# Patient Record
Sex: Female | Born: 1970 | Race: White | Hispanic: No | Marital: Single | Smoking: Never smoker
Health system: Southern US, Community
[De-identification: ages and names within clinical notes are randomized; demographics above are authoritative.]

## PROBLEM LIST (undated history)

## (undated) DIAGNOSIS — J45909 Unspecified asthma, uncomplicated: Secondary | ICD-10-CM

## (undated) DIAGNOSIS — G43909 Migraine, unspecified, not intractable, without status migrainosus: Secondary | ICD-10-CM

## (undated) DIAGNOSIS — T7840XA Allergy, unspecified, initial encounter: Secondary | ICD-10-CM

## (undated) HISTORY — PX: ABDOMINAL HYSTERECTOMY: SHX81

## (undated) HISTORY — PX: CYST REMOVAL WITH BONE GRAFT: SHX6365

## (undated) HISTORY — PX: KNEE ARTHROSCOPY: SHX127

## (undated) HISTORY — DX: Unspecified asthma, uncomplicated: J45.909

## (undated) HISTORY — DX: Allergy, unspecified, initial encounter: T78.40XA

## (undated) HISTORY — DX: Migraine, unspecified, not intractable, without status migrainosus: G43.909

---

## 2004-10-23 ENCOUNTER — Ambulatory Visit (HOSPITAL_COMMUNITY): Admission: RE | Admit: 2004-10-23 | Discharge: 2004-10-23 | Payer: Self-pay | Admitting: Internal Medicine

## 2005-02-13 ENCOUNTER — Encounter: Admission: RE | Admit: 2005-02-13 | Discharge: 2005-02-13 | Payer: Self-pay | Admitting: Orthopedic Surgery

## 2005-05-10 ENCOUNTER — Emergency Department (HOSPITAL_COMMUNITY): Admission: EM | Admit: 2005-05-10 | Discharge: 2005-05-10 | Payer: Self-pay | Admitting: Emergency Medicine

## 2005-05-17 ENCOUNTER — Ambulatory Visit (HOSPITAL_COMMUNITY): Admission: RE | Admit: 2005-05-17 | Discharge: 2005-05-17 | Payer: Self-pay | Admitting: Internal Medicine

## 2006-02-19 ENCOUNTER — Emergency Department (HOSPITAL_COMMUNITY): Admission: EM | Admit: 2006-02-19 | Discharge: 2006-02-19 | Payer: Self-pay | Admitting: Emergency Medicine

## 2006-06-13 ENCOUNTER — Emergency Department (HOSPITAL_COMMUNITY): Admission: EM | Admit: 2006-06-13 | Discharge: 2006-06-13 | Payer: Self-pay | Admitting: Emergency Medicine

## 2006-07-01 ENCOUNTER — Ambulatory Visit: Payer: Self-pay | Admitting: Gastroenterology

## 2006-07-15 ENCOUNTER — Ambulatory Visit (HOSPITAL_COMMUNITY): Admission: RE | Admit: 2006-07-15 | Discharge: 2006-07-15 | Payer: Self-pay | Admitting: Gastroenterology

## 2006-07-30 ENCOUNTER — Ambulatory Visit: Payer: Self-pay | Admitting: Cardiology

## 2006-07-31 ENCOUNTER — Ambulatory Visit: Payer: Self-pay | Admitting: Gastroenterology

## 2007-03-13 ENCOUNTER — Other Ambulatory Visit: Admission: RE | Admit: 2007-03-13 | Discharge: 2007-03-13 | Payer: Self-pay | Admitting: Internal Medicine

## 2008-03-18 ENCOUNTER — Ambulatory Visit (HOSPITAL_COMMUNITY): Admission: RE | Admit: 2008-03-18 | Discharge: 2008-03-18 | Payer: Self-pay | Admitting: Internal Medicine

## 2008-06-07 ENCOUNTER — Encounter (INDEPENDENT_AMBULATORY_CARE_PROVIDER_SITE_OTHER): Payer: Self-pay | Admitting: Obstetrics and Gynecology

## 2008-06-07 ENCOUNTER — Ambulatory Visit (HOSPITAL_COMMUNITY): Admission: RE | Admit: 2008-06-07 | Discharge: 2008-06-08 | Payer: Self-pay | Admitting: Obstetrics and Gynecology

## 2009-01-24 ENCOUNTER — Emergency Department (HOSPITAL_COMMUNITY): Admission: EM | Admit: 2009-01-24 | Discharge: 2009-01-25 | Payer: Self-pay | Admitting: Emergency Medicine

## 2009-11-24 ENCOUNTER — Ambulatory Visit (HOSPITAL_COMMUNITY): Admission: RE | Admit: 2009-11-24 | Discharge: 2009-11-24 | Payer: Self-pay | Admitting: Internal Medicine

## 2009-11-29 ENCOUNTER — Encounter: Admission: RE | Admit: 2009-11-29 | Discharge: 2009-11-29 | Payer: Self-pay | Admitting: Internal Medicine

## 2010-05-16 ENCOUNTER — Encounter
Admission: RE | Admit: 2010-05-16 | Discharge: 2010-05-16 | Payer: Self-pay | Source: Home / Self Care | Attending: Internal Medicine | Admitting: Internal Medicine

## 2010-05-31 ENCOUNTER — Ambulatory Visit (HOSPITAL_COMMUNITY)
Admission: RE | Admit: 2010-05-31 | Discharge: 2010-05-31 | Payer: Self-pay | Source: Home / Self Care | Attending: Internal Medicine | Admitting: Internal Medicine

## 2010-06-01 ENCOUNTER — Ambulatory Visit
Admission: RE | Admit: 2010-06-01 | Discharge: 2010-06-01 | Payer: Self-pay | Source: Home / Self Care | Attending: Internal Medicine | Admitting: Internal Medicine

## 2010-06-01 DIAGNOSIS — J45909 Unspecified asthma, uncomplicated: Secondary | ICD-10-CM | POA: Insufficient documentation

## 2010-06-01 DIAGNOSIS — R05 Cough: Secondary | ICD-10-CM | POA: Insufficient documentation

## 2010-06-02 ENCOUNTER — Telehealth (INDEPENDENT_AMBULATORY_CARE_PROVIDER_SITE_OTHER): Payer: Self-pay | Admitting: *Deleted

## 2010-06-08 ENCOUNTER — Encounter: Payer: Self-pay | Admitting: Internal Medicine

## 2010-06-08 NOTE — Progress Notes (Signed)
Summary: prescription not at pharmacy  Phone Note Call from Patient   Caller: Patient Call For: Dr wert Summary of Call: Patient phoned stated that she saw Dr. Sherene Sires yesterday and the office was to call a prescription into [facility] on [facility] for prednisone. She went by yesterday and they stated that this had not been called in she called again this morning and was told that this has not been called in yet. Patient can be  reached at [phone] Initial call taken by: Vedia Coffer,  June 02, 2010 10:30 AM  Follow-up for Phone Call        reviewed pt's chart and it looks like MW sent prednisone yesterday electronically.  pt states pharmacy has not received rx yet.  called [facility] on [address] and gave telephone order for rx. pt aware.  Aundra Millet Reynolds LPN  June 02, 2010 10:39 AM

## 2010-06-08 NOTE — Assessment & Plan Note (Signed)
Summary: Pulmonary/ new pt eval   Visit Type:  Initial Consult Copy to:  Dr. Marisue Brooklyn Primary Provider/Referring Provider:  Dr. Marisue Brooklyn  CC:  Positive PPD and Cough.  History of Present Illness: 40 Romero never smoked but tested yearly for TB by [facility] and negative 2010.   June 01, 2010  1st pulmonary office eval cc cough started 2.5 weeks ago dry and not assoc with any other uri symptoms and feels sob even if not coughing.  worse as day goes on, no problem sleeping.Marland Kitchen assoc with mild sore throat and dysphagia   Pt denies any significant sore throat, dysphagia, itching, sneezing,  nasal congestion or excess secretions,  fever, chills, sweats, unintended wt loss, pleuritic or exertional cp, hempoptysis, change in activity tolerance  orthopnea pnd or leg swelling Pt also denies any obvious fluctuation in symptoms with weather or environmental change or other alleviating or aggravating factors.       Current Medications (verified): 1)  None  Allergies (verified): 1)  ! 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  The patient denies productive cough, coughing up blood, chest pain, irregular heartbeats, acid heartburn, indigestion, tooth/dental problems, nasal congestion/difficulty breathing  through nose, sneezing, itching, ear ache, anxiety, depression, joint stiffness or pain, rash, change in color of mucus, and fever.    Vital Signs:  Patient profile:   40 year old female Height:      63 inches Weight:      147 pounds BMI:     26.13 O2 Sat:      98 % on Room air Temp:     98.3 degrees F oral Pulse rate:   68 / minute BP sitting:   108 / 70  (left arm)  Vitals Entered By: Vernie Murders (June 01, 2010 10:Karen AM)  O2 Flow:  Room air  Physical Exam  Additional Exam:  amb wf nad  wt 147 June 01, 2010  HEENT: nl dentition, turbinates, and orophanx. Nl external ear canals without cough reflex NECK :  without JVD/Nodes/TM/ nl carotid upstrokes bilaterally LUNGS: no acc muscle use, clear to A and P bilaterally without cough on insp or exp maneuvers CV:  RRR  no s3 or murmur or increase in P2, no edema  ABD:  soft and nontender with nl excursion in the supine position. No bruits or organomegaly, bowel sounds nl MS:  warm without deformities, calf tenderness, cyanosis or clubbing SKIN: warm and dry without lesions   NEURO:  alert, approp, no deficits     CXR  Procedure date:  05/31/2010  Findings:      wnl  Impression & Recommendations:  Problem # 1:  COUGH (ICD-786.2)  this is classified as acute cough but doesn't have the usual uri symptoms assoc with it.  However, c/w  Classic Upper airway cough syndrome, so named because it's frequently impossible to sort out how much is  CR/sinusitis with freq throat clearing (which can be related to primary GERD)   vs  causing  secondary extra esophageal GERD from wide swings in gastric pressure that occur with throat clearing, promoting self use of mint and menthol lozenges that reduce the lower esophageal sphincter tone and exacerbate the problem further These are the same pts who not infrequently have failed to tolerate ace inhibitors,  dry powder inhalers or biphosphonates or report having reflux symptoms that don't  respond to standard doses of PPI  See instructions for specific recommendations   Orders: Consultation Level V ([address])  Problem # 2:  TUBERCULOSIS EXPOSURE (ICD-V01.1)  cxr is nl She is a converter and will need treatment to prevent recurrence. Since works in a public area have contacted the health dept as first step  Orders: Consultation Level V [phone])  Medications Added to Medication List This Visit: 1)  Omeprazole 20 Mg Cpdr (Omeprazole) .... By mouth daily. take one half hour before eating. 2)  Pepcid 20 Mg Tabs (Famotidine) .... Take one by mouth at bedtime 3)  Prednisone 10 Mg Tabs (Prednisone) .... 4 each am x 2days, 2x2days, 1x2days and stop  Patient Instructions: 1)  Call the health department [phone]  re: your Postivie skin test for TB (16 mm)  but it has nothing to do with your cough 2)  Prilosec  30 min before bfast and pepcid 20 mg at bedtime as long as coughing ( reflux is to cough what oxygen is to fire)  3)  Prednisone 10 mg 4 each am x 2days, 2x2days, 1x2days and stop  4)  GERD (REFLUX)  is a common cause of respiratory symptoms. It commonly presents without heartburn and can be treated with medication, but also with lifestyle changes including avoidance of late meals, excessive alcohol, smoking cessation, and avoid fatty foods, chocolate, peppermint, colas, red wine, and acidic juices such as orange juice. NO MINT OR MENTHOL PRODUCTS SO NO COUGH DROPS  5)  USE SUGARLESS CANDY INSTEAD (jolley ranchers)  6)  NO OIL BASED VITAMINS (espeically fish oil)  7)  If the cough persists after 2 weeks return here Prescriptions: PREDNISONE 10 MG  TABS (PREDNISONE) 4 each am x 2days, 2x2days, 1x2days and stop  #14 x 0   Entered and Authorized by:   Nyoka Cowden MD   Signed by:   Nyoka Cowden MD on 06/01/2010   Method used:   Electronically to        [facility] [id number] (retail)       [address] [place], [place]  [address]        Ph: [phone]       Fax: [phone]   RxID:   [id number]

## 2010-08-11 LAB — CBC
HCT: 38.3 % (ref 36.0–46.0)
Hemoglobin: 13.3 g/dL (ref 12.0–15.0)
MCHC: 34.6 g/dL (ref 30.0–36.0)
MCV: 90.3 fL (ref 78.0–100.0)
Platelets: 246 10*3/uL (ref 150–400)
RBC: 4.24 MIL/uL (ref 3.87–5.11)
RDW: 12.2 % (ref 11.5–15.5)
WBC: 7 10*3/uL (ref 4.0–10.5)

## 2010-08-11 LAB — DIFFERENTIAL
Basophils Absolute: 0 10*3/uL (ref 0.0–0.1)
Lymphocytes Relative: 41 % (ref 12–46)
Monocytes Absolute: 0.5 10*3/uL (ref 0.1–1.0)
Neutro Abs: 3.5 10*3/uL (ref 1.7–7.7)
Neutrophils Relative %: 51 % (ref 43–77)

## 2010-08-11 LAB — URINALYSIS, ROUTINE W REFLEX MICROSCOPIC
Bilirubin Urine: NEGATIVE
Glucose, UA: NEGATIVE mg/dL
Hgb urine dipstick: NEGATIVE
Ketones, ur: NEGATIVE mg/dL
Nitrite: NEGATIVE
Protein, ur: NEGATIVE mg/dL
Specific Gravity, Urine: 1.031 — ABNORMAL HIGH (ref 1.005–1.030)
Urobilinogen, UA: 1 mg/dL (ref 0.0–1.0)
pH: 6 (ref 5.0–8.0)

## 2010-08-11 LAB — COMPREHENSIVE METABOLIC PANEL
Albumin: 4.2 g/dL (ref 3.5–5.2)
BUN: 14 mg/dL (ref 6–23)
Chloride: 105 mEq/L (ref 96–112)
Creatinine, Ser: 0.7 mg/dL (ref 0.4–1.2)
Glucose, Bld: 84 mg/dL (ref 70–99)
Total Bilirubin: 0.3 mg/dL (ref 0.3–1.2)

## 2010-08-22 LAB — CBC
HCT: 33.2 % — ABNORMAL LOW (ref 36.0–46.0)
HCT: 39.3 % (ref 36.0–46.0)
Hemoglobin: 11.5 g/dL — ABNORMAL LOW (ref 12.0–15.0)
Hemoglobin: 13.4 g/dL (ref 12.0–15.0)
MCV: 90 fL (ref 78.0–100.0)
MCV: 90.9 fL (ref 78.0–100.0)
Platelets: 234 10*3/uL (ref 150–400)
RBC: 4.36 MIL/uL (ref 3.87–5.11)
RDW: 12.5 % (ref 11.5–15.5)
WBC: 7.4 10*3/uL (ref 4.0–10.5)

## 2010-08-22 LAB — BASIC METABOLIC PANEL
BUN: 8 mg/dL (ref 6–23)
Chloride: 105 mEq/L (ref 96–112)
Chloride: 105 mEq/L (ref 96–112)
GFR calc Af Amer: >60 mL/min (ref >60)
GFR calc non Af Amer: >60 mL/min (ref >60)
Glucose, Bld: 146 mg/dL — ABNORMAL HIGH (ref 70–99)
Potassium: 4.2 mEq/L (ref 3.5–5.1)
Potassium: 4.4 mEq/L (ref 3.5–5.1)
Sodium: 135 mEq/L (ref 135–145)
Sodium: 137 mEq/L (ref 135–145)

## 2010-09-19 NOTE — Discharge Summary (Signed)
[name], [name]          ACCOUNT NO.:  [id number]   MEDICAL RECORD NO.:  [id number]          PATIENT TYPE:  OIB   LOCATION:  9303                          FACILITY:  [facility]   PHYSICIAN:  Sherron Monday, MD        DATE OF BIRTH:  1970/12/29   DATE OF ADMISSION:  06/07/2008  DATE OF DISCHARGE:  06/08/2008                               DISCHARGE SUMMARY   ADMITTING DIAGNOSES:  Menorrhagia and fibroid uterus.   DISCHARGE DIAGNOSES:  Menorrhagia and fibroid uterus, status post total  vaginal hysterectomy.   PROCEDURE:  Total vaginal hysterectomy, excision of skin tag, and  cystoscopy.   HISTORY OF PRESENT ILLNESS:  A 40 year old G2, P0 with complaints of  menorrhagia, clotting, and cramping.  She had an ultrasound performed  which revealed a normal uterus with 3 moderately-sized uterine fibroids.   PAST MEDICAL HISTORY:  Significant for hospitalization for stomach and  chest pain thought to be stress related.   PAST SURGICAL HISTORY:  Significant for knee surgery and wrist surgery.   PAST OB/GYN HISTORY:  She is a G2, P0 with 2 first trimester therapeutic  abortions, has an abnormal Pap smear with a repeat being normal.  She  also has a remote history of gonorrhea.   MEDICATIONS:  Birth control pill.   ALLERGIES:  PENICILLIN, she was told this as a child, she is unsure of  the reaction.   FAMILY HISTORY:  Significant for diabetes, hypertension, and gout as  well as coronary artery disease in an uncle.   HOSPITAL COURSE:  On admission, she is afebrile with vital signs stable  and a benign exam.  After discussion risks, benefits, and alternatives,  the patient opted for definitive management and was admitted on the 1st,  underwent a TVH without complication.  EBL of approximately 100 mL.  Her  postoperative course was relatively uncomplicated.  She remained  afebrile with stable vital signs throughout her postoperative course.  Her hemoglobin decreased from 13.4 to 11.5.   Her creatinine is stable at  0.57 to 0.67.  On postoperative day #1, her pain was well controlled,  she was passing flatus, tolerated a diet,  ambulating, and voiding  without difficulty.  She did have some complaints of edema and this was  reviewed with the patient.  She will call with any questions or problems  and was discharged home with routine discharge instructions and numbers  to call, and she was also given prescriptions for Motrin and Vicodin.     Sherron Monday, MD  Electronically Signed    JB/MEDQ  D:  06/08/2008  T:  06/08/2008  Job:  [id number]

## 2010-09-19 NOTE — Op Note (Signed)
[name], [name]          ACCOUNT NO.:  [id number]   MEDICAL RECORD NO.:  [id number]          PATIENT TYPE:  OIB   LOCATION:  9303                          [facility]ACILITY:  [facility]   PHYSICIAN:  Sherron Monday, MD        DATE OF BIRTH:  10/30/1970   DATE OF PROCEDURE:  06/07/2008  DATE OF DISCHARGE:                               OPERATIVE REPORT   PREOPERATIVE DIAGNOSES:  Menorrhagia, fibroid uterus, rectal skin tags.   POSTOPERATIVE DIAGNOSES:  Menorrhagia, fibroid uterus, rectal skin tags,  large anterior uterine fibroid.   PROCEDURE:  Total vaginal hysterectomy, cystoscopy, excision of rectal  skin tag.   SURGEON:  Sherron Monday, MD   ASSISTANT:  Malachi Pro. Ambrose Mantle, MD   ANESTHESIA:  General.   FINDINGS:  A 4 x 4 cm intramural fibroid, normal ovaries bilaterally,  rectal skin tag.   SPECIMENS:  The uterus and cervix to pathology.   ESTIMATED BLOOD LOSS:  100 mL   IV FLUIDS:  2200 mL.   URINE OUTPUT:  200 mL, clear urine at the end of procedure.   COMPLICATIONS:  None.   DISPOSITION:  Stable to PACU following the procedure.   PROCEDURE:  After informed consent was reviewed with the patient  including risks, benefits, and alternatives of the surgical procedure,  she was transported to the OR, placed on the table in supine position.  General anesthesia was induced and found to be adequate.  She then  placed in the [place]in stirrups, prepped, and draped in the normal  sterile fashion.  A Foley was sterilely placed using a heavy weighted  speculum and a Sims retractor.  Her cervix was easily visualized and the  cervical vaginal junction was infused with 1:50 of vasopressin.  The  uterus was circumscribed with Bovie cautery.  The posterior cul-de-sac  was entered with sharply and the incision was extended.  A figure of  eight suture was placed at 6 o'clock.  Attention was then turned to  uterosacral ligaments ligated performed on both sides and held.  Banano  speculum was then  placed.  In a stepwise fashion, the cardinal ligaments  were ligated and attempt was made to enter anteriorly.  At this time  noted that there was an anterior fibroid.  The cardinal and broad  ligament continued to be suter ligated.  The utero-ovarian pedicles was  ligated on the patient's right.  As the uterus was being removed to  ligate on the patient's left, it was noted there was a large anterior  fibroid that had limited our ability to enter the anterior cul-de-sac.  This was carefully ligated and inspected and hemostasis was found.  Following removal of the uterus, the pedicles were all inspected and  made hemostatic with several stitches of 0 Vicryl as well as Bovie  cautery.  The uterosacral ligaments are plicated.  The vaginal incision  was closed with 2-0 Vicryl in a running locked fashion.  Hemostasis was  noted.  The cystoscopy was performed revealing no injury to the bladder  as well as jets from both ureters.  Following this a Foley was placed  and attention was turned to the rectal skin tag was inspected and  removed with sharp dissection, made hemostatic with Bovie cautery, sent  to pathology.  The patient tolerated the procedure well.  Sponge, lap,  and needle counts were correct x2 at the end of the procedure.      Sherron Monday, MD  Electronically Signed     JB/MEDQ  D:  06/07/2008  T:  06/08/2008  Job:  [id number]

## 2010-09-19 NOTE — H&P (Signed)
[name], [name]          ACCOUNT NO.:  [id number]   MEDICAL RECORD NO.:  [id number]          PATIENT TYPE:  AMB   LOCATION:  SDC                           [facility]ILITY:  [facility]   PHYSICIAN:  Sherron Monday, MD        DATE OF BIRTH:  [birth year]   DATE OF ADMISSION:  DATE OF DISCHARGE:                              HISTORY & PHYSICAL   ADMITTING DIAGNOSES:  Menorrhagia, fibroid uterus.   PROCEDURE PLANNED:  Total vaginal hysterectomy, excision of skin tag,  and cystoscopy.   HISTORY OF PRESENT ILLNESS:  A 40 year old G2, P0 with complaints of  heavier menses, passing clots, as well as some cramping.  She also  complains of occasional headaches, pain with defecation, and some  diarrhea.  She has had an ultrasound by her primary care [name], which  revealed a normal uterus of 5.1 cm x 5.6 cm x 7.4 cm with 3 moderate-  sized fibroids, 2.7 cm x 3.0 cm x 2.9,  second one was 3.7 x 2.7 x 3.2,  and third one was 2.5 x 1.5 x 1.6.  Normal endometrial stripe and normal  ovaries except paraovarian cysts that are persistent.   PAST MEDICAL HISTORY:  Significant only for hospitalization for stomach  pains and chest pains that were thought to be stress related.   PAST SURGICAL HISTORY:  Significant for a knee surgery as well as a  wrist surgery.   ALLERGIES:  PENICILLIN as a child, unsure of the reaction.   MEDICATIONS:  An OCP which in the past she has tried [place].   PAST OBSTETRICS AND GYNECOLOGIC HISTORY:  She has a history of an  abnormal Pap smear with a repeat being normal.  Her last was performed  in 2007 and 2008.  She has a remote history of gonorrhea as well.  Menarche was at age 45-12 with regular monthly cycles lasting 3-5 days.  She describes them as heavy with some pain.  She states she has had no  intermenstrual bleeding.  She does have discharge that she has  intermittent itch with, no postcoital bleeding, and occasional  dyspareunia which has helped with position  change.  She is a G2 with two  first-trimester therapeutic abortions without any complications.   FAMILY HISTORY:  Significant for diabetes, hypertension and gout in her  father.  Questionable blood disorder in the family.  She states this in  the males.  She will get more information about this.  No history of any  breast cancer, colon, or ovarian cancer.  Coronary artery disease in an  uncle.   PHYSICAL EXAMINATION:  VITAL SIGNS:  She is 5 feet 3 inches and weighs  142 pounds.  GENERAL:  No apparent distress.  HEENT:  Mucous membranes are moist.  NECK:  Without lymphadenopathy.  Thyroid within normal limits.  HEART:  Regular rate and rhythm.  LUNGS:  Clear to auscultation bilaterally.  BACK:  Without costovertebral angle tenderness.  BREASTS:  No masses, nontender, and no distortion.  ABDOMEN:  Soft, nontender, and nondistended with positive bowel sounds.  EXTREMITIES:  Symmetric and nontender.  PELVIC:  Normal external female genitalia.  Normal Bartholin, urethral,  and Skene.  Good support was noted.  Cervix and vagina without lesions.  No cervical motion tenderness.  Upper limits of normal uterine size.  Adnexa, no masses and nontender.  She has a firm mass that was negative  for yeast, bacterial vaginitis, or Trichomonas.  She had descensus noted  with the single-tooth tenaculum being used.   The patient on initial exam; hemoglobin was 14.2, had a Pap smear that  was within normal limits with HPV negative at her initial presentation.   ASSESSMENT AND PLAN:  A 40 year old with menorrhagia, also complains of  a skin tag which was noted being perirectal.  On exam in surgery, we  will assess to make sure it is not a hemorrhoid.  Discussed with the  patient the options of hormonal control and endometrial ablation, an  intrauterine device or a transvaginal hysterectomy.  She wishes desire  for a definitive management and discussed with her the risks, benefits,  and alternatives of  the total vaginal hysterectomy including bleeding,  infection, damage to the bowel, bladder, and ureters as well as damage  to blood vessels.  Discussed with the patient also trouble healing.  The  patient voices understanding to all this and understands that these are  not the limits of the risks.  We also discussed with her bilateral  salpingo-oophorectomy at the time of hysterectomy.  The patient states  that she does not want this unless her ovaries look bad.  We discussed  with the patient's surgical menopause and I have planned to avoid a  bilateral salpingo-oophorectomy and plan for skin tag removal as long as  it does not appear to be a hemorrhoid.  The patient voices understanding  to all this and will present for surgery on June 07, 2008.  Her  questions were answered.      Sherron Monday, MD  Electronically Signed     JB/MEDQ  D:  06/04/2008  T:  06/05/2008  Job:  [id number]

## 2010-09-22 NOTE — Assessment & Plan Note (Signed)
[facility]                         GASTROENTEROLOGY OFFICE NOTE   MANJIT, BUFANO                   MRN:          [id number]  DATE:07/31/2006                            DOB:          13-Jul-1970    PROBLEM:  Chest pain.   REASON FOR RETURN:  For scheduled followup.   Since her last visit she has had no further episodes of chest pain.  On  2 separate occasions in the last 5 months she has had severe chest pain,  very first episode was midepigastric, midchest pain that was a 10 out of  10, lasting just a few hours.  The second episode was more in the left  chest under the left breast.  This lasted several hours.  The pain was  incapacitating.  She was seen twice in the ER where she underwent CT  angio and CT of the chest which was unremarkable.  There is no evidence  for cardiac etiology or pulmonary emboli.  She underwent an upper GI  series to rule out a paraesophageal hiatal hernia.  This test was  negative except for mild reflux.  CT of the chest was negative for a  retrogastric mass.  This question was raised by her upper GI series.  She complains of some fullness in the chest.  She believes that she has  gained some weight.  She denies any other complaints.  It is noted that  she did have a CT of the abdomen and pelvis that was unremarkable during  her evaluation in October 2007.   EXAMINATION:  Pulse 80, blood pressure 118/78, weight 143.   IMPRESSION:  Two episodes of severe chest pain of unclear etiology.  It  is possible this may have been due to esophageal spasm.  At this point  there does not appear to be any other pathology.   RECOMMENDATIONS:  NuLev 0.25 mg sublingual with the onset of any chest  pain.  If this is not effective then I would proceed with upper  endoscopy and possible esophageal monometry, and attempt to evaluate her  during an episode of pain.     Barbette Hair. Arlyce Dice, MD,FACG  Electronically Signed    RDK/MedQ  DD: 07/31/2006  DT: 07/31/2006  Job #: [id number]   cc:   Lovenia Kim, D.O.

## 2011-03-22 ENCOUNTER — Other Ambulatory Visit (HOSPITAL_COMMUNITY): Payer: Self-pay | Admitting: Internal Medicine

## 2011-03-22 ENCOUNTER — Ambulatory Visit (HOSPITAL_COMMUNITY)
Admission: RE | Admit: 2011-03-22 | Discharge: 2011-03-22 | Disposition: A | Discharge status: Home / Self Care | Payer: Managed Care, Other (non HMO) | Source: Ambulatory Visit | Attending: Internal Medicine | Admitting: Internal Medicine

## 2011-03-22 DIAGNOSIS — R05 Cough: Secondary | ICD-10-CM | POA: Insufficient documentation

## 2011-03-22 DIAGNOSIS — R059 Cough, unspecified: Secondary | ICD-10-CM | POA: Insufficient documentation

## 2012-04-17 ENCOUNTER — Ambulatory Visit (HOSPITAL_COMMUNITY)
Admission: RE | Admit: 2012-04-17 | Discharge: 2012-04-17 | Disposition: A | Discharge status: Home / Self Care | Payer: Managed Care, Other (non HMO) | Source: Ambulatory Visit | Attending: Internal Medicine | Admitting: Internal Medicine

## 2012-04-17 ENCOUNTER — Other Ambulatory Visit (HOSPITAL_COMMUNITY): Payer: Self-pay | Admitting: Internal Medicine

## 2012-04-17 DIAGNOSIS — Z111 Encounter for screening for respiratory tuberculosis: Secondary | ICD-10-CM

## 2012-04-17 DIAGNOSIS — R05 Cough: Secondary | ICD-10-CM | POA: Insufficient documentation

## 2012-04-17 DIAGNOSIS — R059 Cough, unspecified: Secondary | ICD-10-CM | POA: Insufficient documentation

## 2012-04-17 DIAGNOSIS — R7611 Nonspecific reaction to tuberculin skin test without active tuberculosis: Secondary | ICD-10-CM | POA: Insufficient documentation

## 2012-04-25 ENCOUNTER — Telehealth: Payer: Self-pay | Admitting: Oncology

## 2012-04-25 NOTE — Telephone Encounter (Signed)
LVOM for pt to return call in re NP appt.  °

## 2012-06-03 ENCOUNTER — Telehealth: Payer: Self-pay | Admitting: Oncology

## 2012-06-03 NOTE — Telephone Encounter (Signed)
C/D 06/03/12 for appt. 06/19/12

## 2012-06-03 NOTE — Telephone Encounter (Signed)
S/W PT IN REF TO NP APPT 06/19/12@10 :30 REFERRING DR Oneta Rack DX-HEMOCHROMATOSIS MAILED NP PACKET

## 2012-06-16 ENCOUNTER — Other Ambulatory Visit: Payer: Self-pay | Admitting: Oncology

## 2012-06-17 ENCOUNTER — Telehealth: Payer: Self-pay | Admitting: Oncology

## 2012-06-17 NOTE — Telephone Encounter (Signed)
Pt called today and r/s 2/13 new pt appt to 2/27 @ 10:30am. Pt has new d/t.

## 2012-06-19 ENCOUNTER — Ambulatory Visit: Payer: Managed Care, Other (non HMO)

## 2012-06-19 ENCOUNTER — Ambulatory Visit: Payer: Managed Care, Other (non HMO) | Admitting: Oncology

## 2012-06-19 ENCOUNTER — Other Ambulatory Visit: Payer: Managed Care, Other (non HMO) | Admitting: Lab

## 2012-07-03 ENCOUNTER — Ambulatory Visit: Payer: Managed Care, Other (non HMO) | Admitting: Oncology

## 2012-07-03 ENCOUNTER — Ambulatory Visit: Payer: Managed Care, Other (non HMO)

## 2012-07-03 ENCOUNTER — Other Ambulatory Visit: Payer: Managed Care, Other (non HMO) | Admitting: Lab

## 2012-12-12 IMAGING — CR DG CHEST 2V
2 series · 2 of 2 positions shown · non-contrast
Comparison: Chest x-ray of 03/22/2011

CLINICAL DATA: Positive TB test, history of treatment for TB into
6569, recent cough

CHEST - 2 VIEW

[view not recorded (1 of 2)]
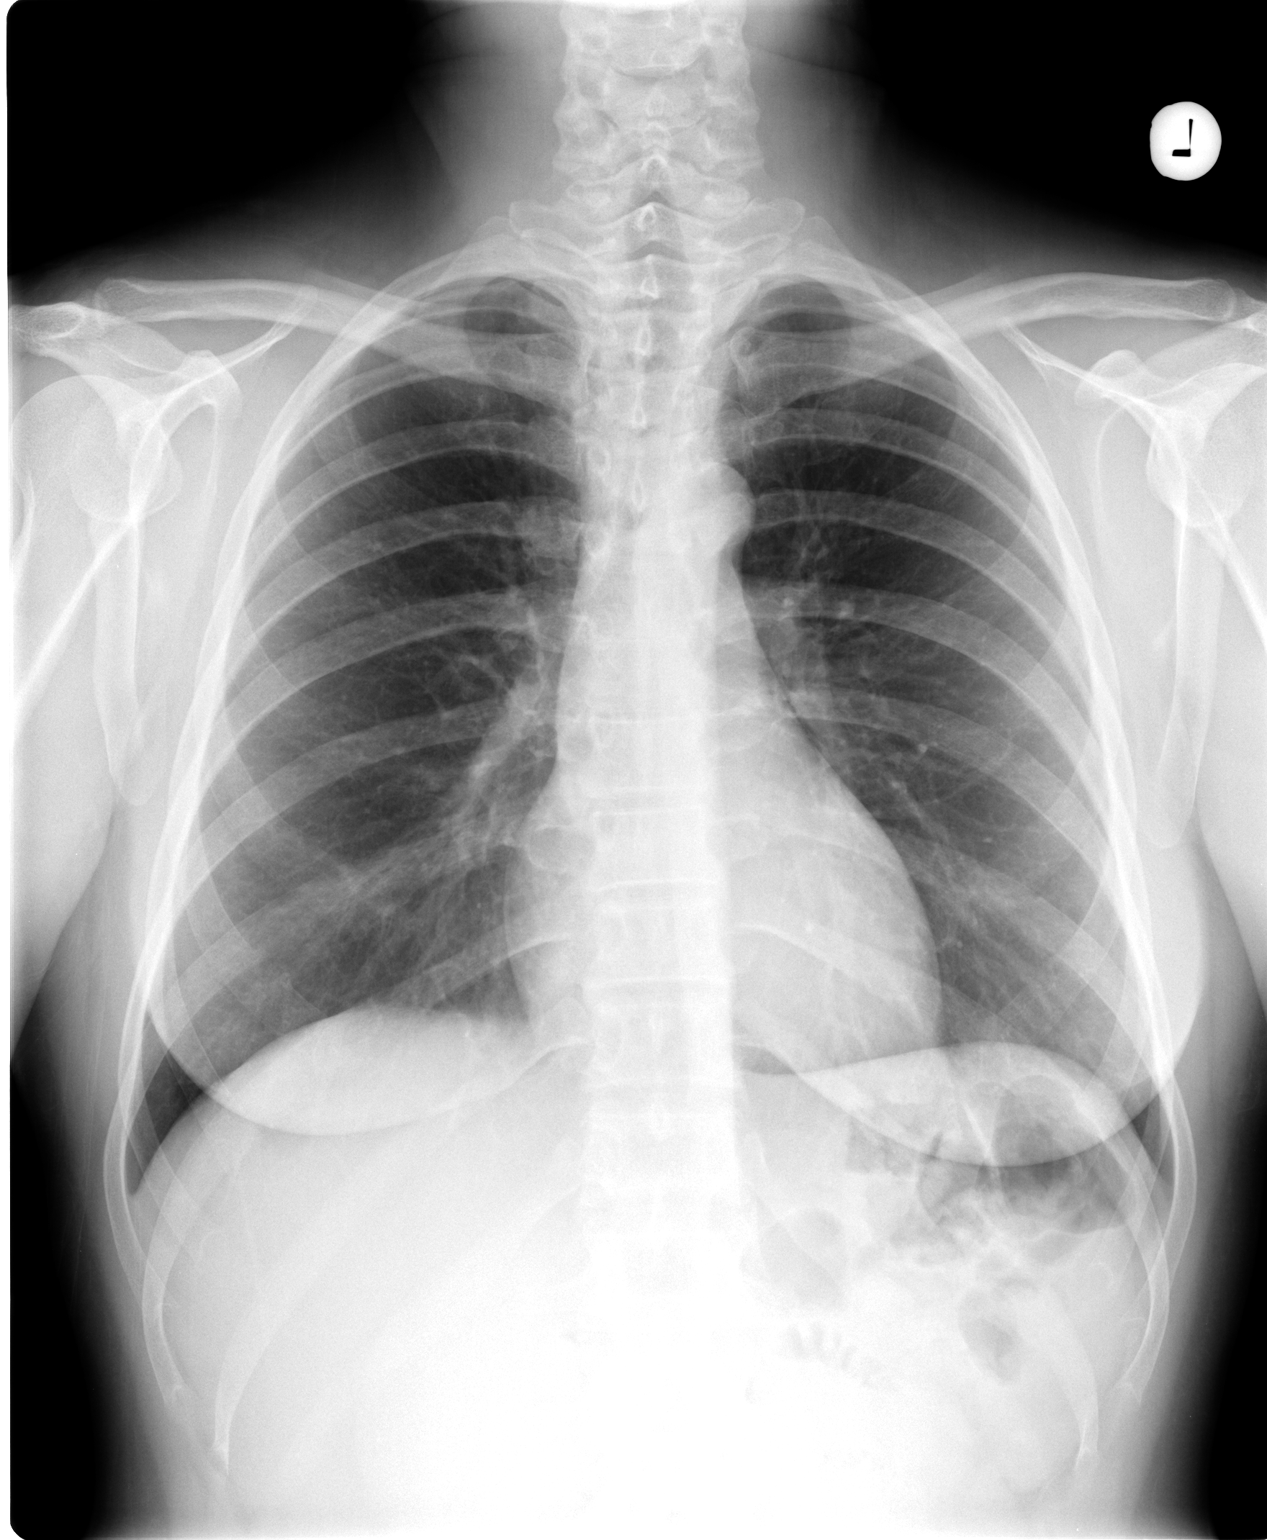

[view not recorded (2 of 2)]
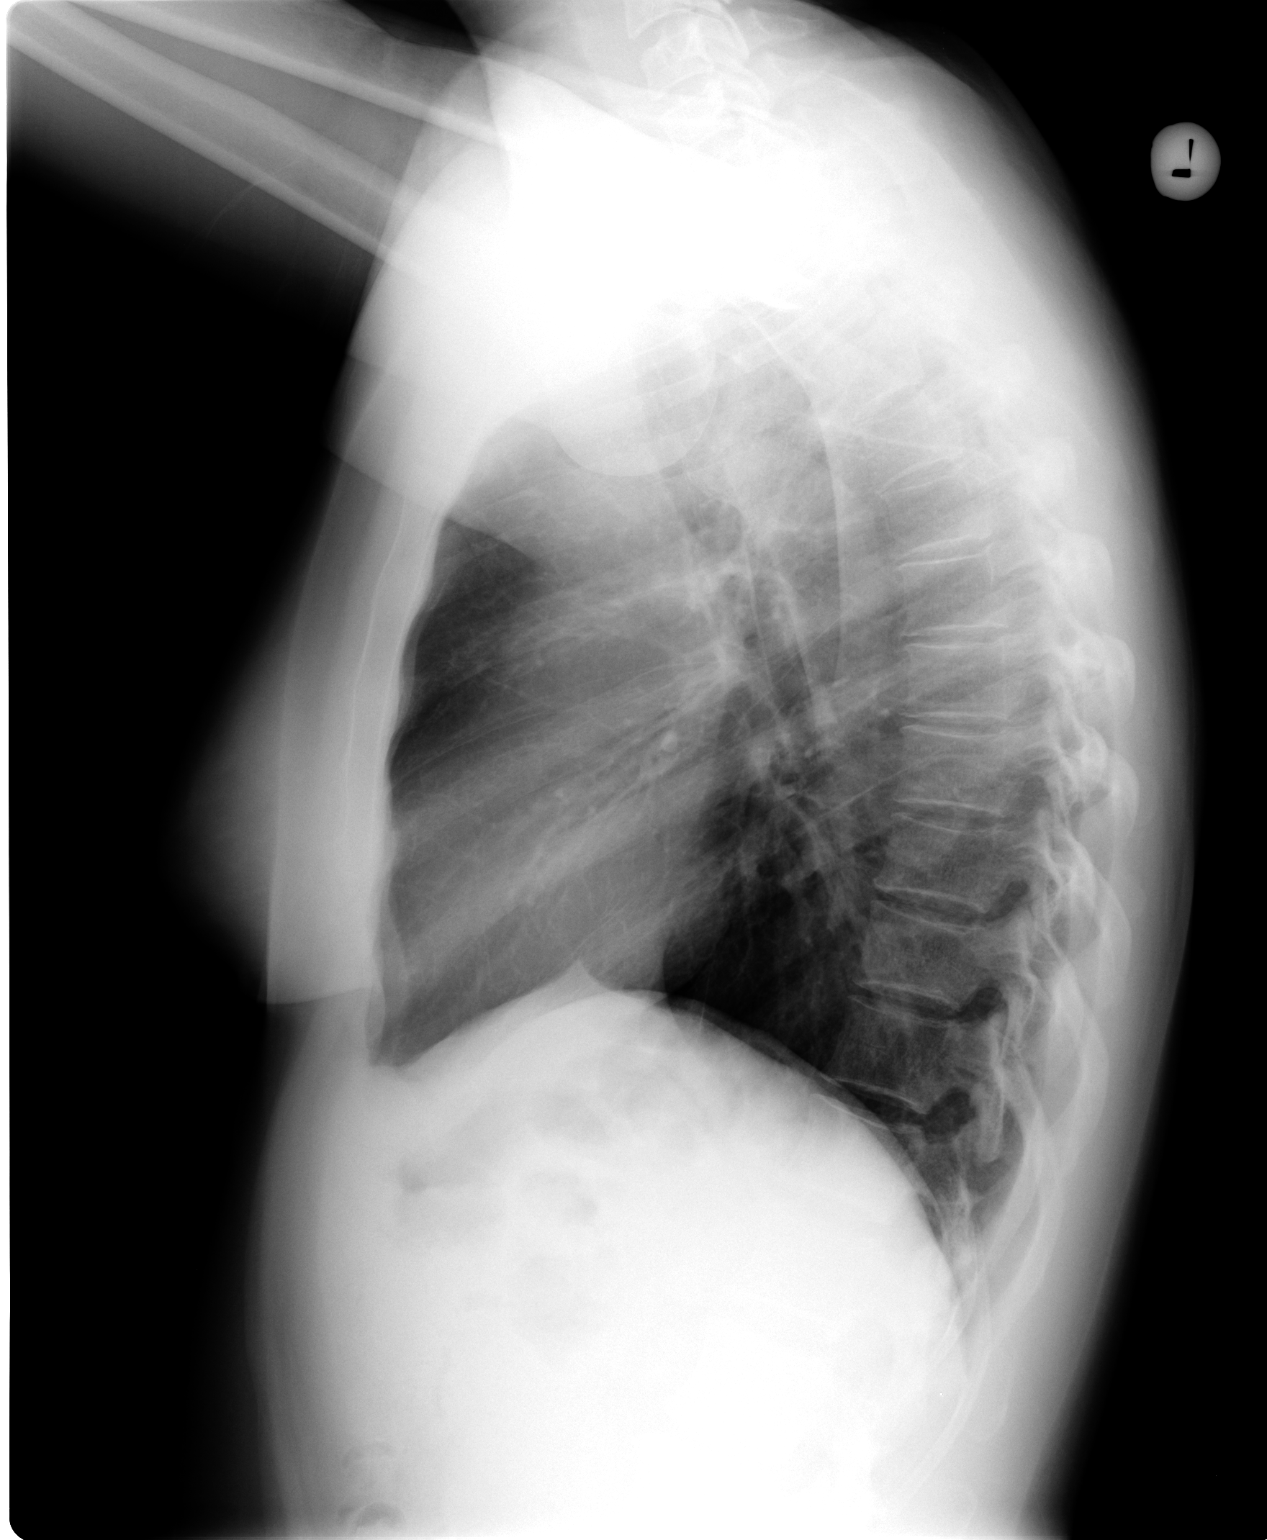

[2 of 2 positions shown; findings below may reference images not displayed]

FINDINGS: No active infiltrate or effusion is seen.  No sequela of
prior tuberculous infection is seen.  Mediastinal contours are
stable.  The heart is within normal limits in size.  No bony
abnormality is seen.
IMPRESSION: Stable chest x-ray.  No active lung disease.

## 2013-03-17 ENCOUNTER — Encounter: Payer: Self-pay | Admitting: Internal Medicine

## 2013-03-17 DIAGNOSIS — T7840XA Allergy, unspecified, initial encounter: Secondary | ICD-10-CM | POA: Insufficient documentation

## 2013-03-17 DIAGNOSIS — G43909 Migraine, unspecified, not intractable, without status migrainosus: Secondary | ICD-10-CM

## 2013-03-17 DIAGNOSIS — J45909 Unspecified asthma, uncomplicated: Secondary | ICD-10-CM

## 2013-03-18 ENCOUNTER — Encounter: Payer: Self-pay | Admitting: Physician Assistant

## 2013-03-18 ENCOUNTER — Ambulatory Visit: Payer: Managed Care, Other (non HMO) | Admitting: Physician Assistant

## 2013-03-18 VITALS — BP 110/68 | HR 72 | Temp 97.9°F | Resp 16 | Wt 151.0 lb

## 2013-03-18 DIAGNOSIS — B373 Candidiasis of vulva and vagina: Secondary | ICD-10-CM

## 2013-03-18 DIAGNOSIS — J0100 Acute maxillary sinusitis, unspecified: Secondary | ICD-10-CM

## 2013-03-18 MED ORDER — PREDNISONE 10 MG PO TABS: 10.0000 mg | ORAL_TABLET | ORAL | Status: DC

## 2013-03-18 MED ORDER — FLUCONAZOLE 100 MG PO TABS: 100.0000 mg | ORAL_TABLET | Freq: Once | ORAL | Status: DC

## 2013-03-18 MED ORDER — FLUTICASONE PROPIONATE 50 MCG/ACT NA SUSP: 1.0000 | Freq: Every day | NASAL | Status: DC

## 2013-03-18 MED ORDER — SULFAMETHOXAZOLE-TMP DS 800-160 MG PO TABS: 1.0000 | ORAL_TABLET | Freq: Two times a day (BID) | ORAL | Status: DC

## 2013-03-18 NOTE — Patient Instructions (Signed)
Place sinusitis patient instructions here.

## 2013-03-18 NOTE — Progress Notes (Signed)
Subjective:     Patient ID: [name], female   DOB: Sep 16, 1970, 42 y.o.   MRN: [id number]  Sinus Problem This is a new problem. The current episode started 1 to 4 weeks ago (Has been for 2 weeks but worse for past week). The problem has been gradually worsening since onset. There has been no fever. The pain is mild. Associated symptoms include congestion, coughing (nonprodcutive), headaches (for 2 weeks in her eyes), sinus pressure and a sore throat. Pertinent negatives include no chills, diaphoresis, ear pain (But has earfullness) or shortness of breath. Swollen glands: Worse in AM. Treatments tried: Allegra OTC. The treatment provided no relief.    No current outpatient prescriptions on file. Past Medical History  Diagnosis Date  . Allergy   . Asthma   . Migraines    Review of Systems  Constitutional: Negative.  Negative for chills and diaphoresis. Fatigue: Mild  fatigue.  HENT: Positive for congestion, postnasal drip, sinus pressure and sore throat. Negative for ear discharge and ear pain (But has earfullness).   Eyes: Negative.   Respiratory: Positive for cough (nonprodcutive). Negative for chest tightness, shortness of breath and wheezing.   Cardiovascular: Negative.   Gastrointestinal: Negative.   Genitourinary: Negative.   Neurological: Positive for headaches (for 2 weeks in her eyes).       Objective:   Physical Exam  Constitutional: She is oriented to person, place, and time. She appears well-developed and well-nourished.  HENT:  Head: Normocephalic and atraumatic.  Right Ear: Hearing, external ear and ear canal normal. A middle ear effusion is present.  Left Ear: External ear normal.  Nose: Right sinus exhibits maxillary sinus tenderness. Left sinus exhibits maxillary sinus tenderness.  Eyes: Conjunctivae are normal. Pupils are equal, round, and reactive to light.  Neck: Normal range of motion. Neck supple.  Cardiovascular: Normal rate and regular rhythm.    Pulmonary/Chest: Effort normal and breath sounds normal.  Abdominal: Soft. Bowel sounds are normal.  Neurological: She is alert and oriented to person, place, and time.      Assessment and Plan       1. Yeast vaginitis - fluconazole (DIFLUCAN) 100 MG tablet; Take 1 tablet (100 mg total) by mouth once.  Dispense: 1 tablet; Refill: 1  2. Acute maxillary sinusitis - sulfamethoxazole-trimethoprim (BACTRIM DS) 800-160 MG per tablet; Take 1 tablet by mouth 2 (two) times daily.  Dispense: 20 tablet; Refill: 0 - fluticasone (FLONASE) 50 MCG/ACT nasal spray; Place 1 spray into both nostrils daily.  Dispense: 16 g; Refill: 2 - predniSONE (DELTASONE) 10 MG tablet; Take 1 tablet (10 mg total) by mouth as directed. 1 pill 2 times a day with food for 3 days, then 1 pill daily with food for 4 days.  Dispense: 10 tablet; Refill: 0

## 2013-03-19 ENCOUNTER — Other Ambulatory Visit: Payer: Self-pay | Admitting: Emergency Medicine

## 2013-03-19 MED ORDER — VILAZODONE HCL 40 MG PO TABS: 40.0000 mg | ORAL_TABLET | Freq: Every day | ORAL | Status: DC

## 2013-03-19 MED ORDER — ALPRAZOLAM 0.5 MG PO TABS: 0.2500 mg | ORAL_TABLET | Freq: Three times a day (TID) | ORAL | Status: DC | PRN

## 2013-03-23 ENCOUNTER — Telehealth: Payer: Self-pay | Admitting: *Deleted

## 2013-03-23 DIAGNOSIS — B373 Candidiasis of vulva and vagina: Secondary | ICD-10-CM

## 2013-03-23 MED ORDER — EPINEPHRINE 0.3 MG/0.3ML IJ SOAJ: 0.3000 mg | Freq: Once | INTRAMUSCULAR | Status: DC

## 2013-03-23 MED ORDER — FLUCONAZOLE 100 MG PO TABS: 100.0000 mg | ORAL_TABLET | Freq: Once | ORAL | Status: AC

## 2013-03-23 NOTE — Telephone Encounter (Signed)
PT WENT TO PHARM TO PICK UP RX'S SAYS CAN WE RECALL EPI PEN & DIFLUCAN TO [facility]  AGAIN IF WE HAVE DONE IT IT WAS NOT THERE.  [facility]   [address]  PH= [phone] FAX= [phone]

## 2013-05-04 ENCOUNTER — Encounter: Payer: Self-pay | Admitting: Emergency Medicine

## 2013-05-04 ENCOUNTER — Ambulatory Visit (INDEPENDENT_AMBULATORY_CARE_PROVIDER_SITE_OTHER): Payer: Managed Care, Other (non HMO) | Admitting: Emergency Medicine

## 2013-05-04 VITALS — BP 126/68 | HR 64 | Temp 98.2°F | Resp 18 | Ht 63.75 in | Wt 155.0 lb

## 2013-05-04 DIAGNOSIS — R7309 Other abnormal glucose: Secondary | ICD-10-CM

## 2013-05-04 DIAGNOSIS — I10 Essential (primary) hypertension: Secondary | ICD-10-CM

## 2013-05-04 DIAGNOSIS — J329 Chronic sinusitis, unspecified: Secondary | ICD-10-CM

## 2013-05-04 DIAGNOSIS — E559 Vitamin D deficiency, unspecified: Secondary | ICD-10-CM

## 2013-05-04 DIAGNOSIS — B379 Candidiasis, unspecified: Secondary | ICD-10-CM

## 2013-05-04 DIAGNOSIS — R3915 Urgency of urination: Secondary | ICD-10-CM

## 2013-05-04 DIAGNOSIS — R635 Abnormal weight gain: Secondary | ICD-10-CM

## 2013-05-04 DIAGNOSIS — Z79899 Other long term (current) drug therapy: Secondary | ICD-10-CM

## 2013-05-04 DIAGNOSIS — Z0000 Encounter for general adult medical examination without abnormal findings: Secondary | ICD-10-CM

## 2013-05-04 DIAGNOSIS — J309 Allergic rhinitis, unspecified: Secondary | ICD-10-CM

## 2013-05-04 DIAGNOSIS — F411 Generalized anxiety disorder: Secondary | ICD-10-CM

## 2013-05-04 DIAGNOSIS — E782 Mixed hyperlipidemia: Secondary | ICD-10-CM

## 2013-05-04 DIAGNOSIS — R5381 Other malaise: Secondary | ICD-10-CM

## 2013-05-04 DIAGNOSIS — E538 Deficiency of other specified B group vitamins: Secondary | ICD-10-CM

## 2013-05-04 LAB — CBC WITH DIFFERENTIAL/PLATELET
Basophils Absolute: 0 10*3/uL (ref 0.0–0.1)
Basophils Relative: 0 % (ref 0–1)
HCT: 37 % (ref 36.0–46.0)
Lymphocytes Relative: 35 % (ref 12–46)
MCHC: 34.6 g/dL (ref 30.0–36.0)
Monocytes Absolute: 0.4 10*3/uL (ref 0.1–1.0)
Neutro Abs: 2.8 10*3/uL (ref 1.7–7.7)
Neutrophils Relative %: 55 % (ref 43–77)
Platelets: 260 10*3/uL (ref 150–400)
RDW: 13.3 % (ref 11.5–15.5)
WBC: 5.1 10*3/uL (ref 4.0–10.5)

## 2013-05-04 LAB — BASIC METABOLIC PANEL WITH GFR
BUN: 15 mg/dL (ref 6–23)
Chloride: 104 mEq/L (ref 96–112)
GFR, Est African American: >89 mL/min
GFR, Est Non African American: >89 mL/min
Potassium: 4.8 mEq/L (ref 3.5–5.3)
Sodium: 138 mEq/L (ref 135–145)

## 2013-05-04 LAB — LIPID PANEL
Cholesterol: 172 mg/dL (ref 0–200)
Total CHOL/HDL Ratio: 2.7 Ratio
VLDL: 20 mg/dL (ref 0–40)

## 2013-05-04 LAB — HEPATIC FUNCTION PANEL
ALT: 16 U/L (ref 0–35)
AST: 21 U/L (ref 0–37)
Albumin: 4.3 g/dL (ref 3.5–5.2)
Alkaline Phosphatase: 85 U/L (ref 39–117)
Bilirubin, Direct: 0.1 mg/dL (ref 0.0–0.3)
Total Bilirubin: 0.3 mg/dL (ref 0.3–1.2)

## 2013-05-04 LAB — TSH: TSH: 1.345 u[IU]/mL (ref 0.350–4.500)

## 2013-05-04 LAB — HEMOGLOBIN A1C
Hgb A1c MFr Bld: 5.3 % (ref <5.7)
Mean Plasma Glucose: 105 mg/dL (ref <117)

## 2013-05-04 LAB — MAGNESIUM: Magnesium: 1.9 mg/dL (ref 1.5–2.5)

## 2013-05-04 LAB — IRON AND TIBC
%SAT: 34 % (ref 20–55)
Iron: 115 ug/dL (ref 42–145)

## 2013-05-04 MED ORDER — FLUCONAZOLE 150 MG PO TABS: 150.0000 mg | ORAL_TABLET | Freq: Every day | ORAL | Status: DC

## 2013-05-04 MED ORDER — ALPRAZOLAM 1 MG PO TABS: 0.5000 mg | ORAL_TABLET | Freq: Three times a day (TID) | ORAL | Status: DC | PRN

## 2013-05-04 MED ORDER — DOXYCYCLINE HYCLATE 100 MG PO TABS: 100.0000 mg | ORAL_TABLET | Freq: Two times a day (BID) | ORAL | Status: DC

## 2013-05-04 MED ORDER — PREDNISONE 10 MG PO TABS: ORAL_TABLET | ORAL | Status: DC

## 2013-05-04 MED ORDER — FEXOFENADINE HCL 60 MG PO TABS: 60.0000 mg | ORAL_TABLET | Freq: Two times a day (BID) | ORAL | Status: DC

## 2013-05-04 NOTE — Patient Instructions (Signed)
Sinusitis Sinusitis is redness, soreness, and puffiness (inflammation) of the air pockets in the bones of your face (sinuses). The redness, soreness, and puffiness can cause air and mucus to get trapped in your sinuses. This can allow germs to grow and cause an infection.  HOME CARE   Drink enough fluids to keep your pee (urine) clear or pale yellow.  Use a humidifier in your home.  Run a hot shower to create steam in the bathroom. Sit in the bathroom with the door closed. Breathe in the steam 3 4 times a day.  Put a warm, moist washcloth on your face 3 4 times a day, or as told by your doctor.  Use salt water sprays (saline sprays) to wet the thick fluid in your nose. This can help the sinuses drain.  Only take medicine as told by your doctor. GET HELP RIGHT AWAY IF:   Your pain gets worse.  You have very bad headaches.  You are sick to your stomach (nauseous).  You throw up (vomit).  You are very sleepy (drowsy) all the time.  Your face is puffy (swollen).  Your vision changes.  You have a stiff neck.  You have trouble breathing. MAKE SURE YOU:   Understand these instructions.  Will watch your condition.  Will get help right away if you are not doing well or get worse. Document Released: 10/10/2007 Document Revised: 01/16/2012 Document Reviewed: 11/27/2011 ExitCare Patient Information 2014 [employer]. Allergic Rhinitis Allergic rhinitis is when the mucous membranes in the nose respond to allergens. Allergens are particles in the air that cause your body to have an allergic reaction. This causes you to release allergic antibodies. Through a chain of events, these eventually cause you to release histamine into the blood stream (hence the use of antihistamines). Although meant to be protective to the body, it is this release that causes your discomfort, such as frequent sneezing, congestion and an itchy runny nose.  CAUSES  The pollen allergens may come from grasses,  trees, and weeds. This is seasonal allergic rhinitis, or "hay fever." Other allergens cause year-round allergic rhinitis (perennial allergic rhinitis) such as house dust mite allergen, pet dander and mold spores.  SYMPTOMS   Nasal stuffiness (congestion).  Runny, itchy nose with sneezing and tearing of the eyes.  There is often an itching of the mouth, eyes and ears. It cannot be cured, but it can be controlled with medications. DIAGNOSIS  If you are unable to determine the offending allergen, skin or blood testing may find it. TREATMENT   Avoid the allergen.  Medications and allergy shots (immunotherapy) can help.  Hay fever may often be treated with antihistamines in pill or nasal spray forms. Antihistamines block the effects of histamine. There are over-the-counter medicines that may help with nasal congestion and swelling around the eyes. Check with your caregiver before taking or giving this medicine. If the treatment above does not work, there are many new medications your caregiver can prescribe. Stronger medications may be used if initial measures are ineffective. Desensitizing injections can be used if medications and avoidance fails. Desensitization is when a patient is given ongoing shots until the body becomes less sensitive to the allergen. Make sure you follow up with your caregiver if problems continue. SEEK MEDICAL CARE IF:   You develop fever (more than 100.5 F (38.1 C).  You develop a cough that does not stop easily (persistent).  You have shortness of breath.  You start wheezing.  Symptoms interfere with   normal daily activities. Document Released: 01/16/2001 Document Revised: 07/16/2011 Document Reviewed: 07/28/2008 ExitCare Patient Information 2014 [employer]Care[employer].  

## 2013-05-05 LAB — VITAMIN D 25 HYDROXY (VIT D DEFICIENCY, FRACTURES): Vit D, 25-Hydroxy: 32 ng/mL (ref 30–89)

## 2013-05-06 ENCOUNTER — Telehealth: Payer: Self-pay

## 2013-05-06 LAB — URINALYSIS, ROUTINE W REFLEX MICROSCOPIC

## 2013-05-06 NOTE — Telephone Encounter (Signed)
Per MS, pt needs to come by for a lab only to give a urine specimen due to lab mix up. Lmom to pt.

## 2013-05-07 DIAGNOSIS — F411 Generalized anxiety disorder: Secondary | ICD-10-CM | POA: Insufficient documentation

## 2013-05-07 NOTE — Progress Notes (Signed)
Subjective:    Patient ID: [name], female    DOB: [birth year], 43 y.o.   MRN: [id number]  HPI Comments: 43 yo female cpe She has been doing better over all. She notes improved mood and energy with counseling and medicine regulation. She feels better control on Buspar. She was attending counseling weekly but has decreased to 1-2 x a month with improvement.  She has noticed mild increase with weight but notes she had not been eating as healthy or exercising routinely. She is up 15# since last CPE.  She has noticed increased sinus congestion and allergy drainage over last couple of weeks. She now has green production from chest and head.  She has chronic yeast infections, especially when she starts to exercise, despite changing clothing and hygiene practices.  Anxiety      Current Outpatient Prescriptions on File Prior to Visit  Medication Sig Dispense Refill  . EPINEPHrine (EPI-PEN) 0.3 mg/0.3 mL SOAJ injection Inject 0.3 mLs (0.3 mg total) into the muscle once. Use as directed  1 Device  1  . fluticasone (FLONASE) 50 MCG/ACT nasal spray Place 1 spray into both nostrils daily.  16 g  2  . predniSONE (DELTASONE) 10 MG tablet Take 1 tablet (10 mg total) by mouth as directed. 1 pill 2 times a day with food for 3 days, then 1 pill daily with food for 4 days.  10 tablet  0  . sulfamethoxazole-trimethoprim (BACTRIM DS) 800-160 MG per tablet Take 1 tablet by mouth 2 (two) times daily.  20 tablet  0  . Vilazodone HCl (VIIBRYD) 40 MG TABS Take 1 tablet (40 mg total) by mouth daily.  90 tablet  0   No current facility-administered medications on file prior to visit.   ALLERGIES Penicillins  Past Medical History  Diagnosis Date  . Allergy   . Asthma   . Migraines     Past Surgical History  Procedure Laterality Date  . Abdominal hysterectomy    . Knee arthroscopy Left   . Cyst removal with bone graft Left     wrist   History  Substance Use Topics  . Smoking status: Never  Smoker   . Smokeless tobacco: Never Used  . Alcohol Use: No    Family History  Problem Relation Age of Onset  . Hypertension Mother   . Hemachromatosis Father     Review of Systems  Constitutional: Positive for unexpected weight change.  HENT: Positive for congestion and sinus pressure.   Eyes:       [facility]- 2014 WNL  Respiratory:       CXR 11/12 WNL  Gastrointestinal:       Arlyce Dice- PRN  Genitourinary: Negative for pelvic pain.       GYN-[facility] WNL 2013 due 2015  Mammo- Over due 7/20111 WNL  Skin:       Emily Filbert- PRN  All other systems reviewed and are negative.   BP 126/68  Pulse 64  Temp(Src) 98.2 F (36.8 C) (Temporal)  Resp 18  Ht 5' 3.75" (1.619 m)  Wt 155 lb (70.308 kg)  BMI 26.82 kg/m2     Objective:   Physical Exam  Nursing note and vitals reviewed. Constitutional: She is oriented to person, place, and time. She appears well-developed and well-nourished. No distress.  HENT:  Head: Normocephalic and atraumatic.  Right Ear: External ear normal.  Left Ear: External ear normal.  Nose: Nose normal.  Mouth/Throat: Oropharynx is clear and moist. No oropharyngeal exudate.  TMS  yellow, maxillary tenderness  Eyes: Conjunctivae and EOM are normal. Pupils are equal, round, and reactive to light. Right eye exhibits no discharge. Left eye exhibits no discharge. No scleral icterus.  Neck: Normal range of motion. Neck supple. No JVD present. No tracheal deviation present. No thyromegaly present.  Cardiovascular: Normal rate, regular rhythm, normal heart sounds and intact distal pulses.   Pulmonary/Chest: Effort normal and breath sounds normal.  Abdominal: Soft. Bowel sounds are normal. She exhibits no distension and no mass. There is no tenderness. There is no rebound and no guarding.  Musculoskeletal: Normal range of motion. She exhibits no edema and no tenderness.  Lymphadenopathy:    She has no cervical adenopathy.  Neurological: She is alert and oriented to person,  place, and time. She has normal reflexes. No cranial nerve deficit. She exhibits normal muscle tone. Coordination normal.  Skin: Skin is warm and dry. No rash noted. No erythema. No pallor.  Psychiatric: She has a normal mood and affect. Her behavior is normal. Judgment and thought content normal.      EKG NSCSPT WNL    Assessment & Plan:  1. CPE- check screening labs. Advised needs to get Mammo updated. 2. Fatigue with anxiety, mild improvement with both with decreased stress- continue counseling AD/ check labs 3. Wt Gain- check labs 4. Sinus infection/ Allergic rhinitis- Allegra OTC, increase H2o, allergy hygiene explained. Pred 10 mg DP, DOXY 100 mg, Flonase all AD. Add mucinex AD. 5. Chronic yeast infections- Hygiene explained, Diflucan 150 mg AD, gold bond OTC.

## 2013-05-08 ENCOUNTER — Other Ambulatory Visit: Payer: Self-pay

## 2013-05-08 ENCOUNTER — Other Ambulatory Visit: Payer: Self-pay | Admitting: Emergency Medicine

## 2013-05-08 ENCOUNTER — Telehealth: Payer: Self-pay | Admitting: *Deleted

## 2013-05-08 DIAGNOSIS — R350 Frequency of micturition: Secondary | ICD-10-CM

## 2013-05-08 MED ORDER — EPINEPHRINE 0.3 MG/0.3ML IJ SOAJ: 0.3000 mg | Freq: Once | INTRAMUSCULAR | Status: DC

## 2013-05-08 NOTE — Telephone Encounter (Signed)
Message copied by Nicholaus CorollaHELMER, Vlada Uriostegui A on Fri May 08, 2013 12:24 PM ------      Message from: [place]ITH, [place] R      Created: Fri May 08, 2013  6:42 AM       Pt will drop off urine ASAP, with lab malfunction with her previous specimen. Add 5000 IU vit d and 250 mg MAg  Both are low. REST great.  REcheck VIT D/ MAG 6 month OV ------

## 2013-05-09 LAB — URINALYSIS, MICROSCOPIC ONLY
Bacteria, UA: NONE SEEN
Casts: NONE SEEN
Crystals: NONE SEEN

## 2013-05-09 LAB — URINALYSIS, ROUTINE W REFLEX MICROSCOPIC
Bilirubin Urine: NEGATIVE
GLUCOSE, UA: NEGATIVE mg/dL
Ketones, ur: NEGATIVE mg/dL
Leukocytes, UA: NEGATIVE
NITRITE: NEGATIVE
PROTEIN: NEGATIVE mg/dL
Specific Gravity, Urine: 1.022 (ref 1.005–1.030)
Urobilinogen, UA: 0.2 mg/dL (ref 0.0–1.0)
pH: 5 (ref 5.0–8.0)

## 2013-05-10 LAB — URINE CULTURE
Colony Count: NO GROWTH
ORGANISM ID, BACTERIA: NO GROWTH

## 2013-05-13 NOTE — Telephone Encounter (Signed)
Spoke with patient about recent lab results and instructions. 

## 2013-05-13 NOTE — Progress Notes (Signed)
sch appts

## 2013-06-10 ENCOUNTER — Other Ambulatory Visit: Payer: Self-pay | Admitting: Emergency Medicine

## 2013-06-10 DIAGNOSIS — R319 Hematuria, unspecified: Secondary | ICD-10-CM

## 2013-06-11 ENCOUNTER — Other Ambulatory Visit: Payer: Self-pay

## 2013-07-16 ENCOUNTER — Encounter: Payer: Self-pay | Admitting: Emergency Medicine

## 2013-07-16 ENCOUNTER — Ambulatory Visit (INDEPENDENT_AMBULATORY_CARE_PROVIDER_SITE_OTHER): Payer: Managed Care, Other (non HMO) | Admitting: Emergency Medicine

## 2013-07-16 VITALS — BP 122/78 | HR 60 | Temp 98.1°F | Resp 16 | Ht 63.0 in | Wt 153.0 lb

## 2013-07-16 DIAGNOSIS — F32A Depression, unspecified: Secondary | ICD-10-CM

## 2013-07-16 DIAGNOSIS — B379 Candidiasis, unspecified: Secondary | ICD-10-CM

## 2013-07-16 DIAGNOSIS — R5383 Other fatigue: Secondary | ICD-10-CM

## 2013-07-16 DIAGNOSIS — F329 Major depressive disorder, single episode, unspecified: Secondary | ICD-10-CM

## 2013-07-16 DIAGNOSIS — R5381 Other malaise: Secondary | ICD-10-CM

## 2013-07-16 DIAGNOSIS — F411 Generalized anxiety disorder: Secondary | ICD-10-CM

## 2013-07-16 DIAGNOSIS — F3289 Other specified depressive episodes: Secondary | ICD-10-CM

## 2013-07-16 DIAGNOSIS — J309 Allergic rhinitis, unspecified: Secondary | ICD-10-CM

## 2013-07-16 MED ORDER — EPINEPHRINE 0.3 MG/0.3ML IJ SOAJ: 0.3000 mg | Freq: Once | INTRAMUSCULAR | Status: AC

## 2013-07-16 MED ORDER — FLUCONAZOLE 150 MG PO TABS: 150.0000 mg | ORAL_TABLET | ORAL | Status: DC

## 2013-07-16 MED ORDER — ALPRAZOLAM 1 MG PO TABS: 0.5000 mg | ORAL_TABLET | Freq: Three times a day (TID) | ORAL | Status: DC | PRN

## 2013-07-16 NOTE — Progress Notes (Signed)
Subjective:    Patient ID: [name], female    DOB: 03/27/1971, 43 y.o.   MRN: [id number]  HPI Comments: 43 yo female increased Vit D and Magnesium AD at last OV. She notes overall she has been doing well, but last week she started getting overwhelmed again. She feels she's making more mistakes and makes her more panicked/ fidgety. She was taking xanax sporadically until last week but since then she has been taking BID to help with anxiety. She sees Dr Rubye Oaks once monthly. She has increased fatigue with increased anxiety.   She notes mild allergy increase and head congestion. She denies any color of production.   She gets yeast infections chronically. She has tried to improve hygiene which has helped. She notes she has improvement with Diflucan and wants refill.    Current Outpatient Prescriptions on File Prior to Visit  Medication Sig Dispense Refill  . ALPRAZolam (XANAX) 1 MG tablet Take 0.5 tablets (0.5 mg total) by mouth 3 (three) times daily as needed for anxiety. Take 1/2 to 1 up to 3 times a day  90 tablet  0  . busPIRone (BUSPAR) 5 MG tablet Take 5 mg by mouth 2 (two) times daily.      Marland Kitchen EPINEPHrine (EPI-PEN) 0.3 mg/0.3 mL SOAJ injection Inject 0.3 mLs (0.3 mg total) into the muscle once. Use as directed  2 Device  2  . fexofenadine (ALLEGRA) 60 MG tablet Take 1 tablet (60 mg total) by mouth 2 (two) times daily.  60 tablet  6  . fluconazole (DIFLUCAN) 150 MG tablet Take 1 tablet (150 mg total) by mouth daily.  4 tablet  1   No current facility-administered medications on file prior to visit.   Allergies  Allergen Reactions  . Penicillins     REACTION: hives   Past Medical History  Diagnosis Date  . Allergy   . Asthma   . Migraines      Review of Systems  Constitutional: Positive for fatigue.  HENT: Positive for congestion and postnasal drip.   Psychiatric/Behavioral: The patient is nervous/anxious.   All other systems reviewed and are negative.   BP 122/78   Pulse 60  Temp(Src) 98.1 F (36.7 C)  Resp 16  Ht 5\' 3"  (1.6 m)  Wt 153 lb (69.4 kg)  BMI 27.11 kg/m2     Objective:   Physical Exam  Nursing note and vitals reviewed. Constitutional: She is oriented to person, place, and time. She appears well-developed and well-nourished. No distress.  HENT:  Head: Normocephalic and atraumatic.  Right Ear: External ear normal.  Left Ear: External ear normal.  Nose: Nose normal.  Mouth/Throat: Oropharynx is clear and moist. No oropharyngeal exudate.  Cloudy TM's bilaterally   Eyes: Conjunctivae and EOM are normal.  Neck: Normal range of motion. Neck supple. No JVD present. No thyromegaly present.  Cardiovascular: Normal rate, regular rhythm, normal heart sounds and intact distal pulses.   Pulmonary/Chest: Effort normal and breath sounds normal.  Abdominal: Soft. Bowel sounds are normal. She exhibits no distension and no mass. There is no tenderness. There is no rebound and no guarding.  Musculoskeletal: Normal range of motion. She exhibits no edema and no tenderness.  Lymphadenopathy:    She has no cervical adenopathy.  Neurological: She is alert and oriented to person, place, and time. No cranial nerve deficit.  Skin: Skin is warm and dry. No rash noted. No erythema. No pallor.  Psychiatric: She has a normal mood and affect. Her  behavior is normal. Judgment and thought content normal.  Tearful but with better insight than previous          Assessment & Plan:  1. Depression/ Anxiety- Controlled currently, continue RX AD w/c if SX increase or ER, recommend counseling if symptoms continue. OOW until Monday, Refills AD  2. Fatigue/Vit DEF - declines recheck labs, increase activity and H2O, continue vitamins AD  3. Allergic rhinitis- Allegra OTC, increase H2o, allergy hygiene explained.  4. Yeast infection- Chronic, Hygiene explained. Diflucan AD

## 2013-07-16 NOTE — Patient Instructions (Signed)
Depression, Adult °Depression is feeling sad, low, down in the dumps, blue, gloomy, or empty. In general, there are two kinds of depression: °· Normal sadness or grief. This can happen after something upsetting. It often goes away on its own within 2 weeks. After losing a loved one (bereavement), normal sadness and grief may last longer than two weeks. It usually gets better with time. °· Clinical depression. This kind lasts longer than normal sadness or grief. It keeps you from doing the things you normally do in life. It is often hard to function at home, work, or at school. It may affect your relationships with others. Treatment is often needed. °GET HELP RIGHT AWAY IF: °· You have thoughts about hurting yourself or others. °· You lose touch with reality (psychotic symptoms). You may: °· See or hear things that are not real. °· Have untrue beliefs about your life or people around you. °· Your medicine is giving you problems. °MAKE SURE YOU: °· Understand these instructions. °· Will watch your condition. °· Will get help right away if you are not doing well or get worse. °Document Released: 05/26/2010 Document Revised: 01/16/2012 Document Reviewed: 08/23/2011 °ExitCare® Patient Information ©2014 ExitCare, [employer]. ° °

## 2013-08-11 ENCOUNTER — Telehealth: Payer: Self-pay

## 2013-08-11 NOTE — Telephone Encounter (Signed)
Pt called c/o sore throat, headache, and earache since Saturday. Pt thinks it's possibly allergies. States symptoms are severe. Please advise. Pt uses [facility]

## 2013-08-11 NOTE — Telephone Encounter (Signed)
Please advise to make sure she using Nose spray AD, Claritin or Zyrtec, Mucinex OTC increase H20. If no change after 3 days OV

## 2013-11-10 ENCOUNTER — Ambulatory Visit: Payer: Self-pay | Admitting: Emergency Medicine

## 2014-05-04 ENCOUNTER — Encounter: Payer: Self-pay | Admitting: Emergency Medicine

## 2014-05-19 ENCOUNTER — Encounter: Payer: Self-pay | Admitting: Emergency Medicine

## 2014-06-29 ENCOUNTER — Encounter: Payer: Self-pay | Admitting: Emergency Medicine

## 2014-08-04 ENCOUNTER — Encounter: Payer: Self-pay | Admitting: Emergency Medicine

## 2015-07-04 ENCOUNTER — Encounter: Payer: Self-pay | Admitting: Emergency Medicine

## 2015-08-04 ENCOUNTER — Encounter: Payer: Self-pay | Admitting: Internal Medicine

## 2015-11-22 ENCOUNTER — Ambulatory Visit (INDEPENDENT_AMBULATORY_CARE_PROVIDER_SITE_OTHER): Payer: Self-pay | Admitting: Physician Assistant

## 2015-11-22 ENCOUNTER — Encounter: Payer: Self-pay | Admitting: Physician Assistant

## 2015-11-22 VITALS — BP 118/60 | HR 61 | Temp 97.7°F | Resp 16 | Ht 63.5 in | Wt 151.6 lb

## 2015-11-22 DIAGNOSIS — T7840XD Allergy, unspecified, subsequent encounter: Secondary | ICD-10-CM

## 2015-11-22 DIAGNOSIS — E559 Vitamin D deficiency, unspecified: Secondary | ICD-10-CM

## 2015-11-22 DIAGNOSIS — Z79899 Other long term (current) drug therapy: Secondary | ICD-10-CM

## 2015-11-22 DIAGNOSIS — G43809 Other migraine, not intractable, without status migrainosus: Secondary | ICD-10-CM

## 2015-11-22 DIAGNOSIS — F411 Generalized anxiety disorder: Secondary | ICD-10-CM

## 2015-11-22 DIAGNOSIS — Z8349 Family history of other endocrine, nutritional and metabolic diseases: Secondary | ICD-10-CM

## 2015-11-22 DIAGNOSIS — J45909 Unspecified asthma, uncomplicated: Secondary | ICD-10-CM

## 2015-11-22 LAB — CBC WITH DIFFERENTIAL/PLATELET
Basophils Absolute: 0 cells/uL (ref 0–200)
Basophils Relative: 0 %
EOS PCT: 1 %
Eosinophils Absolute: 61 cells/uL (ref 15–500)
HCT: 35.9 % (ref 35.0–45.0)
HEMOGLOBIN: 12.2 g/dL (ref 11.7–15.5)
LYMPHS ABS: 1647 {cells}/uL (ref 850–3900)
LYMPHS PCT: 27 %
MCH: 30.6 pg (ref 27.0–33.0)
MCHC: 34 g/dL (ref 32.0–36.0)
MCV: 90 fL (ref 80.0–100.0)
MPV: 8.8 fL (ref 7.5–12.5)
Monocytes Absolute: 549 cells/uL (ref 200–950)
Monocytes Relative: 9 %
NEUTROS PCT: 63 %
Neutro Abs: 3843 cells/uL (ref 1500–7800)
Platelets: 229 10*3/uL (ref 140–400)
RBC: 3.99 MIL/uL (ref 3.80–5.10)
RDW: 12.9 % (ref 11.0–15.0)
WBC: 6.1 10*3/uL (ref 3.8–10.8)

## 2015-11-22 LAB — BASIC METABOLIC PANEL WITH GFR
BUN: 9 mg/dL (ref 7–25)
CO2: 26 mmol/L (ref 20–31)
CREATININE: 0.75 mg/dL (ref 0.50–1.10)
Calcium: 8.4 mg/dL — ABNORMAL LOW (ref 8.6–10.2)
Chloride: 105 mmol/L (ref 98–110)
GFR, Est African American: >89 mL/min (ref >=60)
GFR, Est Non African American: >89 mL/min (ref >=60)
GLUCOSE: 85 mg/dL (ref 65–99)
POTASSIUM: 3.8 mmol/L (ref 3.5–5.3)
SODIUM: 137 mmol/L (ref 135–146)

## 2015-11-22 LAB — FERRITIN: FERRITIN: 83 ng/mL (ref 10–232)

## 2015-11-22 LAB — HEPATIC FUNCTION PANEL
ALT: 6 U/L (ref 6–29)
AST: 12 U/L (ref 10–35)
Albumin: 3.6 g/dL (ref 3.6–5.1)
Alkaline Phosphatase: 78 U/L (ref 33–115)
Bilirubin, Direct: 0.1 mg/dL (ref <=0.2)
Indirect Bilirubin: 0.2 mg/dL (ref 0.2–1.2)
TOTAL PROTEIN: 5.8 g/dL — AB (ref 6.1–8.1)
Total Bilirubin: 0.3 mg/dL (ref 0.2–1.2)

## 2015-11-22 MED ORDER — TRAZODONE HCL 50 MG PO TABS: ORAL_TABLET | ORAL | Status: DC

## 2015-11-22 NOTE — Patient Instructions (Addendum)
Melatonin 10-20mg  gummy or dissolvable 30 mins before bed Try trazodone start 1/2 pill at night 30-60 mins before bed, can increase to a whole or  and can go up to 2 pills at night.   Make sure you have a good sleep routine No caffeine after lunch  Black out curtains Fan in the room Your bed is for sleep and sex, no watching TV in bed    [facility] Mammography Schedule an appointment by calling [phone]. They have 99 dollar mammograms  Please add on b complex and vitamin d 5000 IU   Vitamin D goal is between 60-80  Please make sure that you are taking your Vitamin D as directed.   It is very important as a natural anti-inflammatory   helping hair, skin, and nails, as well as reducing stroke and heart attack risk.   It helps your bones and helps with mood.  It also decreases numerous cancer risks so please take it as directed.   Low Vit D is associated with a 200-300% higher risk for CANCER   and 200-300% higher risk for HEART   ATTACK  &  STROKE.    .....................................Marland Kitchen  It is also associated with higher death rate at younger ages,   autoimmune diseases like Rheumatoid arthritis, Lupus, Multiple Sclerosis.     Also many other serious conditions, like depression, Alzheimer's  Dementia, infertility, muscle aches, fatigue, fibromyalgia - just to name a few.  +++++++++++++++++++  Can get liquid vitamin D from [place]  OR here in [place] at  [facility] alternatives [address] [place], [place] [address]   Varicose Veins Varicose veins are veins that have become enlarged and twisted. CAUSES This condition is the result of valves in the veins not working properly. Valves in the veins help return blood from the leg to the heart. When your calf muscles squeeze, the blood moves up your leg then the valves close and this continues until the blood gets back to your heart.  If these valves are damaged, blood flows backwards and backs up into the veins in the  leg near the skin OR if your are sitting/standing for a long time without using your calf muscles the blood will back up into the veins in your legs. This causes the veins to become larger. People who are on their feet a lot, sit a lot without walking (like on a plane, at a desk, or in a car), who are pregnant, or who are overweight are more likely to develop varicose veins. SYMPTOMS   Bulging, twisted-appearing, bluish veins, most commonly found on the legs.  Leg pain or a feeling of heaviness. These symptoms may be worse at the end of the day.  Leg swelling.  Skin color changes. DIAGNOSIS  Varicose veins can usually be diagnosed with an exam of your legs by your caregiver. He or she may recommend an ultrasound of your leg veins. TREATMENT  Most varicose veins can be treated at home.However, other treatments are available for people who have persistent symptoms or who want to treat the cosmetic appearance of the varicose veins. But this is only cosmetic and they will return if not properly treated. These include:  Laser treatment of very small varicose veins.  Medicine that is shot (injected) into the vein. This medicine hardens the walls of the vein and closes off the vein. This treatment is called sclerotherapy. Afterwards, you may need to wear clothing or bandages that apply pressure.  Surgery. HOME CARE INSTRUCTIONS   Do  not stand or sit in one position for long periods of time. Do not sit with your legs crossed. Rest with your legs raised during the day.  Your legs have to be higher than your heart so that gravity will force the valves to open, so please really elevate your legs.   Wear elastic stockings or support hose. Do not wear other tight, encircling garments around the legs, pelvis, or waist.  [employer]  has a wide variety of well priced compression stockings. [address], [place] [address] [phone]  OR THERE ARE COPPER INFUSED COMPRESSION SOCKS AT  [facility] OR [facility]  Walk as much as possible to increase blood flow.  Raise the foot of your bed at night with 2-inch blocks.  If you get a cut in the skin over the vein and the vein bleeds, lie down with your leg raised and press on it with a clean cloth until the bleeding stops. Then place a bandage (dressing) on the cut. See your caregiver if it continues to bleed or needs stitches. SEEK MEDICAL CARE IF:   The skin around your ankle starts to break down.  You have pain, redness, tenderness, or hard swelling developing in your leg over a vein.  You are uncomfortable due to leg pain. Document Released: 01/31/2005 Document Revised: 07/16/2011 Document Reviewed: 06/19/2010 [facility] Patient Information 2014 [place], [place].  Counseling services   [facility] Hours: Monday-Thursday 830-8pm  Friday 830AM-7PM Address: [address] [phone]  [facility].  Address: [address]     [place], [place] [address] [phone]  [facility] [phone] office [email or web address] [address] [place], [place], [place] [address]

## 2015-11-22 NOTE — Progress Notes (Signed)
Complete Physical  Assessment and Plan: 1. Asthma, unspecified asthma severity, uncomplicated controlled  2. Other migraine without status migrainosus, not intractable Avoid triggers  3. Anxiety state /insomnia, declines regular med at this time, will try trazodone for sleep and better sleep habits and will avoid caffeine.   4. Allergy, subsequent encounter Continue OTC meds  5. Vitamin D deficiency Add supplement  6. Medication management - CBC with Differential/Platelet - BASIC METABOLIC PANEL WITH GFR - Hepatic function panel - Ferritin  7. Family history of hemochromatosis - Ferritin  Discussed med's effects and SE's. Screening labs and tests as requested with regular follow-up as recommended. Over 40 minutes of exam, counseling, chart review, and complex, high level critical decision making was performed this visit.   HPI  45 y.o. female  presents for a complete physical and follow up for has ASTHMA; COUGH; Allergy; Migraines; and Anxiety state. 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   She has excessive worry, trouble sleeping, worrying about the empolyee's she has, is she made the right choice leaving [employer]. She takes xanax PRN but she does not like to take it, states that is causes fatigue/being drained, would prefer something else. Has been on celexa, lexapro, and viibryd in the past. Has been on ativan in the past.   Her blood pressure has been controlled at home, today their BP is BP: 118/60 mmHg She does not workout. She denies chest pain, shortness of breath, dizziness.  She has history of asthma, has followed with Dr. Sherene Sires in the past.  She has history of anxiety, is on xanax PRN.  She is not on cholesterol medication and denies myalgias. Her cholesterol is at goal. The cholesterol last visit was:   Lab Results  Component Value Date   CHOL 172 05/04/2013   HDL 64 05/04/2013   LDLCALC 88  05/04/2013   TRIG 101 05/04/2013   CHOLHDL 2.7 05/04/2013    Last A1C in the office was:  Lab Results  Component Value Date   HGBA1C 5.3 05/04/2013   Patient is on Vitamin D supplement.   Lab Results  Component Value Date   VD25OH 32 05/04/2013      Current Medications:  Current Outpatient Prescriptions on File Prior to Visit  Medication Sig Dispense Refill  . ALPRAZolam (XANAX) 1 MG tablet Take 0.5 tablets (0.5 mg total) by mouth 3 (three) times daily as needed for anxiety. Take 1/2 to 1 up to 3 times a day 90 tablet 0  . EPINEPHrine (EPI-PEN) 0.3 mg/0.3 mL SOAJ injection Inject 0.3 mLs (0.3 mg total) into the muscle once. Use as directed 2 Device 2  . fluconazole (DIFLUCAN) 150 MG tablet Take 1 tablet (150 mg total) by mouth daily. 4 tablet 1   No current facility-administered medications on file prior to visit.   Allergies:  Allergies  Allergen Reactions  . Penicillins     REACTION: hives   Medical History:  She has Asthma; Allergy; Migraines; Anxiety state; and Family history of hemochromatosis on her problem list.   Health Maintenance:   Immunization History  Administered Date(s) Administered  . Td 05/17/2008   Tetanus: 2010 Pneumovax: DUE Prevnar 13: N/A Flu vaccine: Declines Zostavax: N/A  No LMP recorded. Patient has had a hysterectomy. Pap: 05/2011 MGM: 03/2014  DEXA: Colonoscopy: N/A US renal 2012  Patient Care Team: Lucky Cowboy, MD as PCP - General (Internal Medicine) Barbette Hair  Arlyce DiceKaplan, MD as Consulting Physician (Gastroenterology) Sherian ReinJody Bovard-Stuckert, MD as Consulting Physician (Obstetrics and Gynecology) Dominica SeverinWilliam Gramig, MD as Consulting Physician (Orthopedic Surgery) Elmon ElseKaren Gould, MD as Consulting Physician (Dermatology)  Surgical History:  She has past surgical history that includes Abdominal hysterectomy; Knee arthroscopy (Left); and cyst removal with bone graft (Left).   Family History:  Herfamily history includes Hemachromatosis in her  father; Hypertension in her mother.   Social History:  She reports that she has never smoked. She has never used smokeless tobacco. She reports that she does not drink alcohol or use illicit drugs.  Review of Systems: Review of Systems  Constitutional: Negative.   HENT: Negative.   Eyes: Negative.   Respiratory: Negative.   Cardiovascular: Negative.   Gastrointestinal: Negative.   Genitourinary: Negative.   Musculoskeletal: Negative.   Skin: Negative.   Neurological: Negative.   Endo/Heme/Allergies: Negative.   Psychiatric/Behavioral: Negative for depression, suicidal ideas, hallucinations, memory loss and substance abuse. The patient is nervous/anxious and has insomnia.     Physical Exam: Estimated body mass index is 26.43 kg/(m^2) as calculated from the following:   Height as of this encounter: 5' 3.5" (1.613 m).   Weight as of this encounter: 151 lb 9.6 oz (68.765 kg). BP 118/60 mmHg  Pulse 61  Temp(Src) 97.7 F (36.5 C) (Temporal)  Resp 16  Ht 5' 3.5" (1.613 m)  Wt 151 lb 9.6 oz (68.765 kg)  BMI 26.43 kg/m2  SpO2 95% General Appearance: Well nourished, in no apparent distress.  Eyes: PERRLA, EOMs, conjunctiva no swelling or erythema, normal fundi and vessels.  Sinuses: No Frontal/maxillary tenderness  ENT/Mouth: Ext aud canals clear, normal light reflex with TMs without erythema, bulging. Good dentition. No erythema, swelling, or exudate on post pharynx. Tonsils not swollen or erythematous. Hearing normal.  Neck: Supple, thyroid normal. No bruits  Respiratory: Respiratory effort normal, BS equal bilaterally without rales, rhonchi, wheezing or stridor.  Cardio: RRR without murmurs, rubs or gallops. Brisk peripheral pulses without edema.  Chest: symmetric, with normal excursions and percussion.  Breasts: Symmetric, without lumps, nipple discharge, retractions.  Abdomen: Soft, nontender, no guarding, rebound, hernias, masses, or organomegaly.  Lymphatics: Non tender  without lymphadenopathy.  Genitourinary: declines Musculoskeletal: Full ROM all peripheral extremities,5/5 strength, and normal gait.  Skin: Warm, dry without rashes, lesions, ecchymosis. Neuro: Cranial nerves intact, reflexes equal bilaterally. Normal muscle tone, no cerebellar symptoms. Sensation intact.  Psych: Awake and oriented X 3, normal affect, Insight and Judgment appropriate.    Quentin MullingAmanda Shelbylynn Walczyk 9:29 AM [facility]

## 2016-02-23 ENCOUNTER — Ambulatory Visit: Payer: Self-pay | Admitting: Physician Assistant

## 2016-02-28 ENCOUNTER — Encounter: Payer: Self-pay | Admitting: Physician Assistant

## 2016-02-28 ENCOUNTER — Ambulatory Visit (INDEPENDENT_AMBULATORY_CARE_PROVIDER_SITE_OTHER): Payer: Self-pay | Admitting: Physician Assistant

## 2016-02-28 VITALS — BP 126/70 | HR 69 | Temp 98.1°F | Resp 14 | Ht 63.0 in | Wt 153.0 lb

## 2016-02-28 DIAGNOSIS — T7840XD Allergy, unspecified, subsequent encounter: Secondary | ICD-10-CM

## 2016-02-28 DIAGNOSIS — Z79899 Other long term (current) drug therapy: Secondary | ICD-10-CM

## 2016-02-28 DIAGNOSIS — Z8349 Family history of other endocrine, nutritional and metabolic diseases: Secondary | ICD-10-CM

## 2016-02-28 DIAGNOSIS — E559 Vitamin D deficiency, unspecified: Secondary | ICD-10-CM

## 2016-02-28 DIAGNOSIS — G43809 Other migraine, not intractable, without status migrainosus: Secondary | ICD-10-CM

## 2016-02-28 DIAGNOSIS — F411 Generalized anxiety disorder: Secondary | ICD-10-CM

## 2016-02-28 MED ORDER — CITALOPRAM HYDROBROMIDE 20 MG PO TABS: 20.0000 mg | ORAL_TABLET | Freq: Every day | ORAL | 2 refills | Status: DC

## 2016-02-28 MED ORDER — TRAZODONE HCL 50 MG PO TABS: ORAL_TABLET | ORAL | 2 refills | Status: DC

## 2016-02-28 MED ORDER — TRIAMCINOLONE ACETONIDE 0.1 % EX OINT: 1.0000 "application " | TOPICAL_OINTMENT | Freq: Two times a day (BID) | CUTANEOUS | 1 refills | Status: AC

## 2016-02-28 NOTE — Progress Notes (Signed)
3 month follow up  Assessment and Plan:  Asthma, unspecified asthma severity, uncomplicated controlled  Other migraine without status migrainosus, not intractable Avoid triggers  Anxiety state -insomnia, declines regular med at this time, will try trazodone for sleep and better sleep habits and will avoid caffeine.  - willing to get on celexa 20mg  daily   Allergy, subsequent encounter Continue OTC meds  Vitamin D deficiency Add supplement   Discussed med's effects and SE's. Screening labs and tests as requested with regular follow-up as recommended. Over 40 minutes of exam, counseling, chart review, and complex, high level critical decision making was performed this visit.   HPI  45 y.o. female  presents for a complete physical and follow up for has ASTHMA; COUGH; Allergy; Migraines; and Anxiety state. 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   Her blood pressure has been controlled at home, today their BP is BP: 126/70 She does not workout. She denies chest pain, shortness of breath, dizziness.  She has history of asthma, has followed with Dr. Sherene Sires in the past.  She has history of anxiety, is on xanax PRN.  She is not on cholesterol medication and denies myalgias. Her cholesterol is at goal. The cholesterol last visit was:   Lab Results  Component Value Date   CHOL 172 05/04/2013   HDL 64 05/04/2013   LDLCALC 88 05/04/2013   TRIG 101 05/04/2013   CHOLHDL 2.7 05/04/2013    Last A1C in the office was:  Lab Results  Component Value Date   HGBA1C 5.3 05/04/2013   Patient is on Vitamin D supplement.   Lab Results  Component Value Date   VD25OH 32 05/04/2013      Current Medications:  Current Outpatient Prescriptions on File Prior to Visit  Medication Sig Dispense Refill  . EPINEPHrine (EPI-PEN) 0.3 mg/0.3 mL SOAJ injection Inject 0.3 mLs (0.3 mg total) into the muscle once. Use as directed 2 Device 2   . traZODone (DESYREL) 50 MG tablet 1/2-1 tablet for sleep 30 tablet 2   No current facility-administered medications on file prior to visit.    Allergies:  Allergies  Allergen Reactions  . Penicillins     REACTION: hives   Medical History:  She has Asthma; Allergy; Migraines; Anxiety state; and Family history of hemochromatosis on her problem list.   Review of Systems: Review of Systems  Constitutional: Negative.   HENT: Negative.   Eyes: Negative.   Respiratory: Negative.   Cardiovascular: Negative.   Gastrointestinal: Negative.   Genitourinary: Negative.   Musculoskeletal: Negative.   Skin: Negative.   Neurological: Negative.   Endo/Heme/Allergies: Negative.   Psychiatric/Behavioral: Negative for depression, hallucinations, memory loss, substance abuse and suicidal ideas. The patient is nervous/anxious and has insomnia.     Physical Exam: Estimated body mass index is 27.1 kg/m as calculated from the following:   Height as of this encounter: 5\' 3"  (1.6 m).   Weight as of this encounter: 153 lb (69.4 kg). BP 126/70   Pulse 69   Temp 98.1 F (36.7 C)   Resp 14   Ht 5\' 3"  (1.6 m)   Wt 153 lb (69.4 kg)   SpO2 97%   BMI 27.10 kg/m  General Appearance: Well nourished, in no apparent distress.  Eyes: PERRLA, EOMs, conjunctiva no swelling or erythema, normal fundi and vessels.  Sinuses: No Frontal/maxillary tenderness  ENT/Mouth: Ext aud canals clear, normal light  reflex with TMs without erythema, bulging. Good dentition. No erythema, swelling, or exudate on post pharynx. Tonsils not swollen or erythematous. Hearing normal.  Neck: Supple, thyroid normal. No bruits  Respiratory: Respiratory effort normal, BS equal bilaterally without rales, rhonchi, wheezing or stridor.  Cardio: RRR without murmurs, rubs or gallops. Brisk peripheral pulses without edema.  Chest: symmetric, with normal excursions and percussion.  Abdomen: Soft, nontender, no guarding, rebound, hernias,  masses, or organomegaly.  Lymphatics: Non tender without lymphadenopathy.  Musculoskeletal: Full ROM all peripheral extremities,5/5 strength, and normal gait.  Skin: Warm, dry without rashes, lesions, ecchymosis. Neuro: Cranial nerves intact, reflexes equal bilaterally. Normal muscle tone, no cerebellar symptoms. Sensation intact.  Psych: Awake and oriented X 3, normal affect, Insight and Judgment appropriate.    Quentin MullingAmanda Collier 11:14 AM [facility]

## 2016-02-28 NOTE — Patient Instructions (Addendum)
Trazodone can try 1/2-1 pill or up to 2 at night, can take AS needed, do not have to take daily If it does not help we will try a medication called amitriptyline  Get on the celexa 20mg  daily for anxiety Will take 1 month to get in your system Can stop in the future when things calm down.    Vitamin D goal is between 60-80  Please make sure that you are taking your Vitamin D as directed.   It is very important as a natural anti-inflammatory   helping hair, skin, and nails, as well as reducing stroke and heart attack risk.   It helps your bones and helps with mood.  It also decreases numerous cancer risks so please take it as directed.   Low Vit D is associated with a 200-300% higher risk for CANCER   and 200-300% higher risk for HEART   ATTACK  &  STROKE.    .....................................Marland Kitchen.  It is also associated with higher death rate at younger ages,   autoimmune diseases like Rheumatoid arthritis, Lupus, Multiple Sclerosis.     Also many other serious conditions, like depression, Alzheimer's  Dementia, infertility, muscle aches, fatigue, fibromyalgia - just to name a few.  +++++++++++++++++++  Can get liquid vitamin D from [place]  OR [place] at  [facility] [address], [place], [place] [id number]

## 2016-06-05 ENCOUNTER — Encounter: Payer: Self-pay | Admitting: Physician Assistant

## 2016-06-05 ENCOUNTER — Ambulatory Visit (INDEPENDENT_AMBULATORY_CARE_PROVIDER_SITE_OTHER): Payer: Self-pay | Admitting: Physician Assistant

## 2016-06-05 VITALS — BP 102/80 | HR 69 | Temp 97.1°F | Resp 16 | Ht 63.0 in | Wt 160.0 lb

## 2016-06-05 DIAGNOSIS — F411 Generalized anxiety disorder: Secondary | ICD-10-CM

## 2016-06-05 DIAGNOSIS — Z79899 Other long term (current) drug therapy: Secondary | ICD-10-CM

## 2016-06-05 MED ORDER — CITALOPRAM HYDROBROMIDE 40 MG PO TABS: 40.0000 mg | ORAL_TABLET | Freq: Every day | ORAL | 1 refills | Status: DC

## 2016-06-05 MED ORDER — TRAZODONE HCL 100 MG PO TABS: ORAL_TABLET | ORAL | 1 refills | Status: DC

## 2016-06-05 NOTE — Progress Notes (Signed)
3 month follow up  Assessment and Plan:  Asthma, unspecified asthma severity, uncomplicated controlled  Other migraine without status migrainosus, not intractable Avoid triggers  Anxiety state -insomnia, continue trazodone, and better sleep habits and will avoid caffeine.  On celexa 20mg  daily.    Allergy, subsequent encounter Continue OTC meds  Vitamin D deficiency Add supplement   Discussed med's effects and SE's. Screening labs and tests as requested with regular follow-up as recommended.  Future Appointments Date Time Provider Department Center  11/22/2016 9:00 AM Quentin MullingAmanda Collier, PA-C [id number] None    HPI  46 y.o. female  presents for a follow up for ASTHMA; COUGH; Allergy; Migraines; and Anxiety state. 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   Her blood pressure has been controlled at home, today their BP is BP: 102/80 She does not workout. She denies chest pain, shortness of breath, dizziness.  She has history of asthma, has followed with Dr. Sherene SiresWert in the past.  She has history of anxiety, is on xanax PRN.  She is not on cholesterol medication and denies myalgias. Her cholesterol is at goal. The cholesterol last visit was:   Lab Results  Component Value Date   CHOL 172 05/04/2013   HDL 64 05/04/2013   LDLCALC 88 05/04/2013   TRIG 101 05/04/2013   CHOLHDL 2.7 05/04/2013    Last A1C in the office was:  Lab Results  Component Value Date   HGBA1C 5.3 05/04/2013   Patient is on Vitamin D supplement.   Lab Results  Component Value Date   VD25OH 32 05/04/2013     BMI is Body mass index is 28.34 kg/m., she is working on diet and exercise. Wt Readings from Last 3 Encounters:  06/05/16 160 lb (72.6 kg)  02/28/16 153 lb (69.4 kg)  11/22/15 151 lb 9.6 oz (68.8 kg)    Current Medications:  Current Outpatient Prescriptions on File Prior to Visit  Medication Sig Dispense Refill  . citalopram  (CELEXA) 20 MG tablet Take 1 tablet (20 mg total) by mouth daily. 30 tablet 2  . EPINEPHrine (EPI-PEN) 0.3 mg/0.3 mL SOAJ injection Inject 0.3 mLs (0.3 mg total) into the muscle once. Use as directed 2 Device 2  . traZODone (DESYREL) 50 MG tablet 1/2-1 tablet for sleep 30 tablet 2  . triamcinolone ointment (KENALOG) 0.1 % Apply 1 application topically 2 (two) times daily. 80 g 1   No current facility-administered medications on file prior to visit.    Allergies:  Allergies  Allergen Reactions  . Penicillins     REACTION: hives   Medical History:  She has Asthma; Allergy; Migraines; Anxiety state; and Family history of hemochromatosis on her problem list.   Review of Systems: Review of Systems  Constitutional: Negative.   HENT: Negative.   Eyes: Negative.   Respiratory: Negative.   Cardiovascular: Negative.   Gastrointestinal: Negative.   Genitourinary: Negative.   Musculoskeletal: Negative.   Skin: Negative.   Neurological: Negative.   Endo/Heme/Allergies: Negative.   Psychiatric/Behavioral: Negative for depression, hallucinations, memory loss, substance abuse and suicidal ideas. The patient is nervous/anxious and has insomnia.     Physical Exam: Estimated body mass index is 28.34 kg/m as calculated from the following:   Height as of this encounter: 5\' 3"  (1.6 m).   Weight as of this encounter: 160 lb (72.6 kg). BP 102/80   Pulse 69   Temp 97.1 F (36.2  C)   Resp 16   Ht 5\' 3"  (1.6 m)   Wt 160 lb (72.6 kg)   SpO2 97%   BMI 28.34 kg/m  General Appearance: Well nourished, in no apparent distress.  Eyes: PERRLA, EOMs, conjunctiva no swelling or erythema, normal fundi and vessels.  Sinuses: No Frontal/maxillary tenderness  ENT/Mouth: Ext aud canals clear, normal light reflex with TMs without erythema, bulging. Good dentition. No erythema, swelling, or exudate on post pharynx. Tonsils not swollen or erythematous. Hearing normal.  Neck: Supple, thyroid normal. No bruits   Respiratory: Respiratory effort normal, BS equal bilaterally without rales, rhonchi, wheezing or stridor.  Cardio: RRR without murmurs, rubs or gallops. Brisk peripheral pulses without edema.  Chest: symmetric, with normal excursions and percussion.  Abdomen: Soft, nontender, no guarding, rebound, hernias, masses, or organomegaly.  Lymphatics: Non tender without lymphadenopathy.  Musculoskeletal: Full ROM all peripheral extremities,5/5 strength, and normal gait.  Skin: Warm, dry without rashes, lesions, ecchymosis. Neuro: Cranial nerves intact, reflexes equal bilaterally. Normal muscle tone, no cerebellar symptoms. Sensation intact.  Psych: Awake and oriented X 3, normal affect, Insight and Judgment appropriate.    Quentin Mulling 11:30 AM [facility]

## 2016-07-30 ENCOUNTER — Encounter: Payer: Self-pay | Admitting: Physician Assistant

## 2016-07-30 ENCOUNTER — Ambulatory Visit (INDEPENDENT_AMBULATORY_CARE_PROVIDER_SITE_OTHER): Payer: Self-pay | Admitting: Physician Assistant

## 2016-07-30 VITALS — BP 118/76 | HR 76 | Temp 98.4°F | Resp 16 | Ht 63.0 in | Wt 160.0 lb

## 2016-07-30 DIAGNOSIS — J0100 Acute maxillary sinusitis, unspecified: Secondary | ICD-10-CM

## 2016-07-30 MED ORDER — PREDNISONE 20 MG PO TABS: ORAL_TABLET | ORAL | 0 refills | Status: DC

## 2016-07-30 MED ORDER — BENZONATATE 100 MG PO CAPS: 200.0000 mg | ORAL_CAPSULE | Freq: Three times a day (TID) | ORAL | 1 refills | Status: DC | PRN

## 2016-07-30 MED ORDER — AZITHROMYCIN 250 MG PO TABS: ORAL_TABLET | ORAL | 1 refills | Status: AC

## 2016-07-30 MED ORDER — CITALOPRAM HYDROBROMIDE 40 MG PO TABS: 40.0000 mg | ORAL_TABLET | Freq: Every day | ORAL | 1 refills | Status: DC

## 2016-07-30 NOTE — Patient Instructions (Signed)
Make sure you are on an allergy pill, see below for more details. Please take the prednisone as directed below, this is NOT an antibiotic so you do NOT have to finish it. You can take it for a few days and stop it if you are doing better.   Please take the prednisone to help decrease inflammation and therefore decrease symptoms. Take it it with food to avoid GI upset. It can cause increased energy but on the other hand it can make it hard to sleep at night so please take it AT NIGHT WITH DINNER, it takes 8-12 hours to start working so it will NOT affect your sleeping if you take it at night with your food!!  If you are diabetic it will increase your sugars so decrease carbs and monitor your sugars closely.     HOW TO TREAT VIRAL COUGH AND COLD SYMPTOMS:  -Symptoms usually last at least 1 week with the worst symptoms being around day 4.  - colds usually start with a sore throat and end with a cough, and the cough can take 2 weeks to get better.  -No antibiotics are needed for colds, flu, sore throats, cough, bronchitis UNLESS symptoms are longer than 7 days OR if you are getting better then get drastically worse.  -There are a lot of combination medications (Dayquil, Nyquil, Vicks 44, tyelnol cold and sinus, ETC). Please look at the ingredients on the back so that you are treating the correct symptoms and not doubling up on medications/ingredients.    Medicines you can use  Nasal congestion  - pseudoephedrine (Sudafed)- behind the counter, do not use if you have high blood pressure, medicine that have -D in them.  - phenylephrine (Sudafed PE) -Dextormethorphan + chlorpheniramine (Coridcidin HBP)- okay if you have high blood pressure -Oxymetazoline (Afrin) nasal spray- LIMIT to 3 days -Saline nasal spray -Neti pot (used distilled or bottled water)  Ear pain/congestion  -pseudoephedrine (sudafed) - Nasonex/flonase nasal spray  Fever  -Acetaminophen (Tyelnol) -Ibuprofen (Advil, motrin,  aleve)  Sore Throat  -Acetaminophen (Tyelnol) -Ibuprofen (Advil, motrin, aleve) -Drink a lot of water -Gargle with salt water - Rest your voice (don't talk) -Throat sprays -Cough drops  Body Aches  -Acetaminophen (Tyelnol) -Ibuprofen (Advil, motrin, aleve)  Headache  -Acetaminophen (Tyelnol) -Ibuprofen (Advil, motrin, aleve) - Exedrin, Exedrin Migraine  Allergy symptoms (cough, sneeze, runny nose, itchy eyes) -Claritin or loratadine cheapest but likely the weakest  -Zyrtec or certizine at night because it can make you sleepy -The strongest is allegra or fexafinadine  Cheapest at [facility], [employer], [employer]  Cough  -Dextromethorphan (Delsym)- medicine that has DM in it -Guafenesin (Mucinex/Robitussin) - cough drops - drink lots of water  Chest Congestion  -Guafenesin (Mucinex/Robitussin)  Red Itchy Eyes  - Naphcon-A  Upset Stomach  - Bland diet (nothing spicy, greasy, fried, and high acid foods like tomatoes, oranges, berries) -OKAY- cereal, bread, soup, crackers, rice -Eat smaller more frequent meals -reduce caffeine, no alcohol -Loperamide (Imodium-AD) if diarrhea -Prevacid for heart burn  General health when sick  -Hydration -wash your hands frequently -keep surfaces clean -change pillow cases and sheets often -Get fresh air but do not exercise strenuously -Vitamin D, double up on it - Vitamin C -Zinc    Your ears and sinuses are connected by the eustachian tube. When your sinuses are inflamed, this can close off the tube and cause fluid to collect in your middle ear. This can then cause dizziness, popping, clicking, ringing, and echoing in your ears.  This is often NOT an infection and does NOT require antibiotics, it is caused by inflammation so the treatments help the inflammation. This can take a long time to get better so please be patient.  Here are things you can do to help with this: - Try the Flonase or Nasonex. Remember to spray each nostril twice  towards the outer part of your eye.  Do not sniff but instead pinch your nose and tilt your head back to help the medicine get into your sinuses.  The best time to do this is at bedtime.Stop if you get blurred vision or nose bleeds.  -While drinking fluids, pinch and hold nose close and swallow, to help open eustachian tubes to drain fluid behind ear drums. -Please pick one of the over the counter allergy medications below and take it once daily for allergies.  It will also help with fluid behind ear drums. Claritin or loratadine cheapest but likely the weakest  Zyrtec or certizine at night because it can make you sleepy The strongest is allegra or fexafinadine  Cheapest at [facility], [employer], [facility] -can use decongestant over the counter, please do not use if you have high blood pressure or certain heart conditions.   if worsening HA, changes vision/speech, imbalance, weakness go to the ER

## 2016-07-30 NOTE — Progress Notes (Signed)
   Subjective:    Patient ID: [name], female    DOB: 1970-08-19, 46 y.o.   MRN: [id number]  HPI 46 y.o. WF presents with sinus infection. Had about 2-3 weeks ago, was getting better than for the last week got progressively worse. Nasal congestion. ST. Back pain. Left ear pain, nonproductive cough. She is on mucinex, theraflu, OTC sinus stuff, not on allergy pill.   Blood pressure 118/76, pulse 76, temperature 98.4 F (36.9 C), resp. rate 16, height 5\' 3"  (1.6 m), weight 160 lb (72.6 kg), SpO2 97 %.  Medications Current Outpatient Prescriptions on File Prior to Visit  Medication Sig  . citalopram (CELEXA) 40 MG tablet Take 1 tablet (40 mg total) by mouth daily.  Marland Kitchen. EPINEPHrine (EPI-PEN) 0.3 mg/0.3 mL SOAJ injection Inject 0.3 mLs (0.3 mg total) into the muscle once. Use as directed  . traZODone (DESYREL) 100 MG tablet 1/2-1 tablet for sleep  . triamcinolone ointment (KENALOG) 0.1 % Apply 1 application topically 2 (two) times daily.   No current facility-administered medications on file prior to visit.     Problem list She has Asthma; Allergy; Migraines; Anxiety state; and Family history of hemochromatosis on her problem list.   Review of Systems  Constitutional: Negative for chills and diaphoresis.  HENT: Positive for congestion, postnasal drip, sinus pressure and sneezing. Negative for ear pain and sore throat.   Respiratory: Positive for cough. Negative for chest tightness, shortness of breath and wheezing.   Cardiovascular: Negative.   Gastrointestinal: Negative.   Genitourinary: Negative.   Musculoskeletal: Negative for neck pain.  Neurological: Positive for headaches.       Objective:   Physical Exam  Constitutional: She appears well-developed and well-nourished.  HENT:  Head: Normocephalic and atraumatic.  Right Ear: External ear normal.  Nose: Right sinus exhibits maxillary sinus tenderness. Right sinus exhibits no frontal sinus tenderness. Left sinus  exhibits maxillary sinus tenderness. Left sinus exhibits no frontal sinus tenderness.  Eyes: Conjunctivae and EOM are normal.  Neck: Normal range of motion. Neck supple.  Cardiovascular: Normal rate, regular rhythm, normal heart sounds and intact distal pulses.   Pulmonary/Chest: Effort normal and breath sounds normal. No respiratory distress. She has no wheezes.  Abdominal: Soft. Bowel sounds are normal.  Lymphadenopathy:    She has cervical adenopathy.  Skin: Skin is warm and dry.       Assessment & Plan:  1. Acute non-recurrent maxillary sinusitis - azithromycin (ZITHROMAX) 250 MG tablet; Take 2 tablets (500 mg) on  Day 1,  followed by 1 tablet (250 mg) once daily on Days 2 through 5.  Dispense: 6 each; Refill: 1 - predniSONE (DELTASONE) 20 MG tablet; 2 tablets daily for 3 days, 1 tablet daily for 4 days.  Dispense: 10 tablet; Refill: 0 - benzonatate (TESSALON PERLES) 100 MG capsule; Take 2 capsules (200 mg total) by mouth 3 (three) times daily as needed for cough (Max: 600mg  per day).  Dispense: 30 capsule; Refill: 1

## 2016-08-06 ENCOUNTER — Other Ambulatory Visit: Payer: Self-pay | Admitting: Physician Assistant

## 2016-08-06 DIAGNOSIS — J0100 Acute maxillary sinusitis, unspecified: Secondary | ICD-10-CM

## 2016-08-06 MED ORDER — PREDNISONE 20 MG PO TABS: ORAL_TABLET | ORAL | 0 refills | Status: AC

## 2016-08-06 MED ORDER — AZITHROMYCIN 250 MG PO TABS: ORAL_TABLET | ORAL | 1 refills | Status: AC

## 2016-08-06 MED ORDER — BENZONATATE 100 MG PO CAPS: 200.0000 mg | ORAL_CAPSULE | Freq: Three times a day (TID) | ORAL | 1 refills | Status: AC | PRN

## 2016-11-22 ENCOUNTER — Encounter: Payer: Self-pay | Admitting: Physician Assistant

## 2016-11-28 NOTE — Progress Notes (Deleted)
Complete Physical  Assessment and Plan:  Asthma, unspecified asthma severity, uncomplicated controlled   Other migraine without status migrainosus, not intractable Avoid triggers   Anxiety state /insomnia, declines regular med at this time, will try trazodone for sleep and better sleep habits and will avoid caffeine.   Allergy, subsequent encounter Continue OTC meds  Vitamin D deficiency Add supplement   Medication management - CBC with Differential/Platelet - BASIC METABOLIC PANEL WITH GFR - Hepatic function panel - Ferritin  Family history of hemochromatosis - Ferritin  Discussed med's effects and SE's. Screening labs and tests as requested with regular follow-up as recommended. Over 40 minutes of exam, counseling, chart review, and complex, high level critical decision making was performed this visit.   HPI  46 y.o. female  presents for a complete physical and follow up for has ASTHMA; COUGH; Allergy; Migraines; and Anxiety state. 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   She has excessive worry, trouble sleeping, worrying about the empolyee's she has, is she made the right choice leaving [employer]. She takes xanax PRN but she does not like to take it, states that is causes fatigue/being drained, would prefer something else. Has been on celexa, lexapro, and viibryd in the past. Has been on ativan in the past.   Her blood pressure has been controlled at home, today their BP is   She does not workout. She denies chest pain, shortness of breath, dizziness.  She has history of asthma, has followed with Dr. Sherene SiresWert in the past.  She has history of anxiety, is on xanax PRN.  She is not on cholesterol medication and denies myalgias. Her cholesterol is at goal. The cholesterol last visit was:   Lab Results  Component Value Date   CHOL 172 05/04/2013   HDL 64 05/04/2013   LDLCALC 88 05/04/2013   TRIG 101 05/04/2013    CHOLHDL 2.7 05/04/2013    Last A1C in the office was:  Lab Results  Component Value Date   HGBA1C 5.3 05/04/2013   Patient is on Vitamin D supplement.   Lab Results  Component Value Date   VD25OH 32 05/04/2013      Current Medications:  Current Outpatient Prescriptions on File Prior to Visit  Medication Sig Dispense Refill  . benzonatate (TESSALON PERLES) 100 MG capsule Take 2 capsules (200 mg total) by mouth 3 (three) times daily as needed for cough (Max: 600mg  per day). 30 capsule 1  . citalopram (CELEXA) 40 MG tablet Take 1 tablet (40 mg total) by mouth daily. 90 tablet 1  . EPINEPHrine (EPI-PEN) 0.3 mg/0.3 mL SOAJ injection Inject 0.3 mLs (0.3 mg total) into the muscle once. Use as directed 2 Device 2  . predniSONE (DELTASONE) 20 MG tablet 2 tablets daily for 3 days, 1 tablet daily for 4 days. 10 tablet 0  . traZODone (DESYREL) 100 MG tablet 1/2-1 tablet for sleep 90 tablet 1  . triamcinolone ointment (KENALOG) 0.1 % Apply 1 application topically 2 (two) times daily. 80 g 1   No current facility-administered medications on file prior to visit.    Allergies:  Allergies  Allergen Reactions  . Penicillins     REACTION: hives   Medical History:  She has Asthma; Allergy; Migraines; Anxiety state; and Family history of hemochromatosis on her problem list.   Health Maintenance:   Immunization History  Administered Date(s) Administered  . Td 05/17/2008   Tetanus:  2010 Pneumovax: DUE Prevnar 13: N/A Flu vaccine: Declines Zostavax: N/A  No LMP recorded. Patient has had a hysterectomy. Pap: 05/2011 MGM: 03/2014  DEXA: Colonoscopy: N/A US renal 2012  Patient Care Team: Lucky CowboyMcKeown, William, MD as PCP - General (Internal Medicine) Louis MeckelKaplan, Robert D, MD as Consulting Physician (Gastroenterology) Sherian ReinBovard-Stuckert, Jody, MD as Consulting Physician (Obstetrics and Gynecology) Dominica SeverinGramig, William, MD as Consulting Physician (Orthopedic Surgery) Elmon ElseGould, Karen, MD as Consulting  Physician (Dermatology)  Surgical History:  She has a past surgical history that includes Abdominal hysterectomy; Knee arthroscopy (Left); and cyst removal with bone graft (Left).   Family History:  Herfamily history includes Hemachromatosis in her father; Hypertension in her mother.   Social History:  She reports that she has never smoked. She has never used smokeless tobacco. She reports that she does not drink alcohol or use drugs.  Review of Systems: Review of Systems  Constitutional: Negative.   HENT: Negative.   Eyes: Negative.   Respiratory: Negative.   Cardiovascular: Negative.   Gastrointestinal: Negative.   Genitourinary: Negative.   Musculoskeletal: Negative.   Skin: Negative.   Neurological: Negative.   Endo/Heme/Allergies: Negative.   Psychiatric/Behavioral: Negative for depression, hallucinations, memory loss, substance abuse and suicidal ideas. The patient is not nervous/anxious and does not have insomnia.     Physical Exam: Estimated body mass index is 28.34 kg/m as calculated from the following:   Height as of 07/30/16: 5\' 3"  (1.6 m).   Weight as of 07/30/16: 160 lb (72.6 kg). There were no vitals taken for this visit. General Appearance: Well nourished, in no apparent distress.  Eyes: PERRLA, EOMs, conjunctiva no swelling or erythema, normal fundi and vessels.  Sinuses: No Frontal/maxillary tenderness  ENT/Mouth: Ext aud canals clear, normal light reflex with TMs without erythema, bulging. Good dentition. No erythema, swelling, or exudate on post pharynx. Tonsils not swollen or erythematous. Hearing normal.  Neck: Supple, thyroid normal. No bruits  Respiratory: Respiratory effort normal, BS equal bilaterally without rales, rhonchi, wheezing or stridor.  Cardio: RRR without murmurs, rubs or gallops. Brisk peripheral pulses without edema.  Chest: symmetric, with normal excursions and percussion.  Breasts: Symmetric, without lumps, nipple discharge, retractions.   Abdomen: Soft, nontender, no guarding, rebound, hernias, masses, or organomegaly.  Lymphatics: Non tender without lymphadenopathy.  Genitourinary: declines Musculoskeletal: Full ROM all peripheral extremities,5/5 strength, and normal gait.  Skin: Warm, dry without rashes, lesions, ecchymosis. Neuro: Cranial nerves intact, reflexes equal bilaterally. Normal muscle tone, no cerebellar symptoms. Sensation intact.  Psych: Awake and oriented X 3, normal affect, Insight and Judgment appropriate.    Quentin MullingAmanda Vanessa Kampf 12:19 PM [facility]

## 2016-11-29 ENCOUNTER — Encounter: Payer: Self-pay | Admitting: Physician Assistant

## 2017-01-22 ENCOUNTER — Encounter: Payer: Self-pay | Admitting: Physician Assistant

## 2017-01-22 ENCOUNTER — Ambulatory Visit (INDEPENDENT_AMBULATORY_CARE_PROVIDER_SITE_OTHER): Payer: Self-pay | Admitting: Physician Assistant

## 2017-01-22 VITALS — BP 124/70 | HR 72 | Temp 96.8°F | Resp 18 | Ht 63.0 in | Wt 159.2 lb

## 2017-01-22 DIAGNOSIS — H65492 Other chronic nonsuppurative otitis media, left ear: Secondary | ICD-10-CM

## 2017-01-22 DIAGNOSIS — R609 Edema, unspecified: Secondary | ICD-10-CM

## 2017-01-22 LAB — COMPREHENSIVE METABOLIC PANEL
AG Ratio: 1.7 (calc) (ref 1.0–2.5)
ALBUMIN MSPROF: 4.3 g/dL (ref 3.6–5.1)
ALKALINE PHOSPHATASE (APISO): 92 U/L (ref 33–115)
ALT: 11 U/L (ref 6–29)
AST: 12 U/L (ref 10–35)
BILIRUBIN TOTAL: 0.3 mg/dL (ref 0.2–1.2)
BUN: 10 mg/dL (ref 7–25)
CALCIUM: 9.4 mg/dL (ref 8.6–10.2)
CHLORIDE: 106 mmol/L (ref 98–110)
CO2: 26 mmol/L (ref 20–32)
CREATININE: 0.73 mg/dL (ref 0.50–1.10)
GLOBULIN: 2.5 g/dL (ref 1.9–3.7)
Glucose, Bld: 88 mg/dL (ref 65–99)
Potassium: 5.2 mmol/L (ref 3.5–5.3)
Sodium: 140 mmol/L (ref 135–146)
Total Protein: 6.8 g/dL (ref 6.1–8.1)

## 2017-01-22 LAB — CBC WITH DIFFERENTIAL/PLATELET
BASOS PCT: 0.1 %
Basophils Absolute: 7 cells/uL (ref 0–200)
EOS ABS: 60 {cells}/uL (ref 15–500)
Eosinophils Relative: 0.9 %
HEMATOCRIT: 38.9 % (ref 35.0–45.0)
Hemoglobin: 13.2 g/dL (ref 11.7–15.5)
LYMPHS ABS: 1903 {cells}/uL (ref 850–3900)
MCH: 30.5 pg (ref 27.0–33.0)
MCHC: 33.9 g/dL (ref 32.0–36.0)
MCV: 89.8 fL (ref 80.0–100.0)
MPV: 9.4 fL (ref 7.5–12.5)
Monocytes Relative: 9.4 %
Neutro Abs: 4100 cells/uL (ref 1500–7800)
Neutrophils Relative %: 61.2 %
PLATELETS: 270 10*3/uL (ref 140–400)
RBC: 4.33 10*6/uL (ref 3.80–5.10)
RDW: 12.2 % (ref 11.0–15.0)
TOTAL LYMPHOCYTE: 28.4 %
WBC: 6.7 10*3/uL (ref 3.8–10.8)
WBCMIX: 630 {cells}/uL (ref 200–950)

## 2017-01-22 MED ORDER — CITALOPRAM HYDROBROMIDE 40 MG PO TABS: 40.0000 mg | ORAL_TABLET | Freq: Every day | ORAL | 1 refills | Status: AC

## 2017-01-22 MED ORDER — TRAZODONE HCL 100 MG PO TABS: ORAL_TABLET | ORAL | 1 refills | Status: AC

## 2017-01-22 NOTE — Patient Instructions (Addendum)
Our lower leg venous system is not the most reliable, the heart does NOT pump fluid up, there is a valve system.  The muscles of the leg squeeze and the blood moves up and a valve opens and close, then they squeeze, blood moves up and valves open and close.  Lots can go wrong with this valve system.  If someone is sitting or standing without movement, everyone will get swelling.  Varicose Veins Varicose veins are veins that have become enlarged and twisted. CAUSES This condition is the result of valves in the veins not working properly. Valves in the veins help return blood from the leg to the heart. When your calf muscles squeeze, the blood moves up your leg then the valves close and this continues until the blood gets back to your heart.  If these valves are damaged, blood flows backwards and backs up into the veins in the leg near the skin OR if your are sitting/standing for a long time without using your calf muscles the blood will back up into the veins in your legs. This causes the veins to become larger. People who are on their feet a lot, sit a lot without walking (like on a plane, at a desk, or in a car), who are pregnant, or who are overweight are more likely to develop varicose veins. SYMPTOMS   Bulging, twisted-appearing, bluish veins, most commonly found on the legs.  Leg pain or a feeling of heaviness. These symptoms may be worse at the end of the day.  Leg swelling.  Skin color changes. DIAGNOSIS  Varicose veins can usually be diagnosed with an exam of your legs by your caregiver. He or she may recommend an ultrasound of your leg veins. TREATMENT  Most varicose veins can be treated at home.However, other treatments are available for people who have persistent symptoms or who want to treat the cosmetic appearance of the varicose veins. But this is only cosmetic and they will return if not properly treated. These include:  Laser treatment of very small varicose veins.  Medicine  that is shot (injected) into the vein. This medicine hardens the walls of the vein and closes off the vein. This treatment is called sclerotherapy. Afterwards, you may need to wear clothing or bandages that apply pressure.  Surgery. HOME CARE INSTRUCTIONS   Do not stand or sit in one position for long periods of time. Do not sit with your legs crossed. Rest with your legs raised during the day.  Your legs have to be higher than your heart so that gravity will force the valves to open, so please really elevate your legs.   Wear elastic stockings or support hose. Do not wear other tight, encircling garments around the legs, pelvis, or waist.  [employer]  has a wide variety of well priced compression stockings. [address] [place], [place] [address] [phone]  OR THERE ARE COPPER INFUSED COMPRESSION SOCKS AT [facility] OR [facility] OR [employer]  Walk as much as possible to increase blood flow.  Raise the foot of your bed at night with 2-inch blocks.  If you get a cut in the skin over the vein and the vein bleeds, lie down with your leg raised and press on it with a clean cloth until the bleeding stops. Then place a bandage (dressing) on the cut. See your caregiver if it continues to bleed or needs stitches. SEEK MEDICAL CARE IF:   The skin around your ankle starts to break down.  You have pain, redness, tenderness, or hard swelling developing in your leg over a vein.  You are uncomfortable due to leg pain. Document Released: 01/31/2005 Document Revised: 07/16/2011 Document Reviewed: 06/19/2010 [facility] Patient Information 2014 [place], [place].

## 2017-01-22 NOTE — Progress Notes (Signed)
Subjective:    Patient ID: [name], female    DOB: 1970/12/18, 46 y.o.   MRN: [id number]  HPI 46 y.o. self pay WF presents with bilateral leg swelling x 12 days.  She has had bilateral leg swelling x 2 weeks, noticed ankles first. No pain. Has been working extra hard at work, has worked every day x 3 weeks without break, She owns [employer], will stand in one spot a lot. Her mom has varicose veins/swelling.  Left ear feels full, feels like fluid in it.  Blood pressure 124/70, pulse 72, temperature (!) 96.8 F (36 C), resp. rate 18, height  (1.6 m), weight 159 lb 3.2 oz (72.2 kg), SpO2 98 %.  Medications Current Outpatient Prescriptions on File Prior to Visit  Medication Sig  . citalopram (CELEXA) 40 MG tablet Take 1 tablet (40 mg total) by mouth daily.  Marland Kitchen EPINEPHrine (EPI-PEN) 0.3 mg/0.3 mL SOAJ injection Inject 0.3 mLs (0.3 mg total) into the muscle once. Use as directed  . traZODone (DESYREL) 100 MG tablet 1/2-1 tablet for sleep  . triamcinolone ointment (KENALOG) 0.1 % Apply 1 application topically 2 (two) times daily.  . benzonatate (TESSALON PERLES) 100 MG capsule Take 2 capsules (200 mg total) by mouth 3 (three) times daily as needed for cough (Max:  per day). (Patient not taking: Reported on 01/22/2017)  . predniSONE (DELTASONE) 20 MG tablet 2 tablets daily for 3 days, 1 tablet daily for 4 days. (Patient not taking: Reported on 01/22/2017)   No current facility-administered medications on file prior to visit.     Problem list She has Asthma; Allergy; Migraines; Anxiety state; and Family history of hemochromatosis on her problem list.   Review of Systems  Constitutional: Negative.  Negative for chills, fatigue and fever.  HENT: Negative.   Respiratory: Negative.  Negative for apnea, choking and shortness of breath.   Cardiovascular: Positive for leg swelling. Negative for chest pain and palpitations.  Gastrointestinal: Negative.  Negative for  abdominal distention.  Genitourinary: Negative.  Negative for frequency.  Musculoskeletal: Negative.   Skin: Negative.   Neurological: Negative.   Hematological: Negative.   Psychiatric/Behavioral: Negative.        Objective:   Physical Exam  Constitutional: She is oriented to person, place, and time. She appears well-developed and well-nourished. No distress.  HENT:  Head: Normocephalic and atraumatic.  Right Ear: External ear normal.  Left Ear: External ear normal.  Nose: Nose normal.  Mouth/Throat: Oropharynx is clear and moist. No oropharyngeal exudate.  Cloudy TM's bilaterally  Eyes: Conjunctivae and EOM are normal.  Neck: Normal range of motion. Neck supple. No JVD present. No thyromegaly present.  Cardiovascular: Normal rate, regular rhythm, normal heart sounds and intact distal pulses.   Pulmonary/Chest: Effort normal and breath sounds normal.  Abdominal: Soft. Bowel sounds are normal. She exhibits no distension and no mass. There is no tenderness. There is no rebound and no guarding.  Musculoskeletal: Normal range of motion. She exhibits edema (very mild peripheral edema). She exhibits no tenderness.  Lymphadenopathy:    She has no cervical adenopathy.  Neurological: She is alert and oriented to person, place, and time. No cranial nerve deficit.  Skin: Skin is warm and dry. No rash noted. No erythema. No pallor.  Psychiatric: She has a normal mood and affect. Her behavior is normal. Judgment and thought content normal.  Nursing note and vitals reviewed.      Assessment & Plan:   Edema, unspecified  type - no SOB, no red flag symptoms - elevate legs TID, increase activity, increase water, decrease sodium intake.  Wear compression socks  Return to the office if no change with symptoms. Check labs. -     CBC with Differential/Platelet -     Comprehensive metabolic panel  Ear effusion, no infection -Allergy pill, flonase, autoinflation, explained no need for ABX at  this time.   Refill meds -     citalopram (CELEXA) 40 MG tablet; Take 1 tablet (40 mg total) by mouth daily. -     traZODone (DESYREL) 100 MG tablet; 1/2-1 tablet for sleep

## 2017-01-24 NOTE — Progress Notes (Signed)
Pt aware of lab results & voiced understanding of those results.

## 2017-12-04 ENCOUNTER — Encounter: Payer: Self-pay | Admitting: Physician Assistant
